# Patient Record
Sex: Male | Born: 1975 | Race: White | Hispanic: No | Marital: Single | Smoking: Never smoker
Health system: Southern US, Community
[De-identification: ages and names within clinical notes are randomized; demographics above are authoritative.]

## PROBLEM LIST (undated history)

## (undated) DIAGNOSIS — F4310 Post-traumatic stress disorder, unspecified: Secondary | ICD-10-CM

## (undated) HISTORY — PX: EYE SURGERY: SHX253

## (undated) HISTORY — PX: KNEE SURGERY: SHX244

## (undated) HISTORY — PX: CHOLECYSTECTOMY: SHX55

## (undated) HISTORY — PX: NASAL SINUS SURGERY: SHX719

---

## 2005-05-07 ENCOUNTER — Emergency Department (HOSPITAL_COMMUNITY): Admission: EM | Admit: 2005-05-07 | Discharge: 2005-05-07 | Payer: Self-pay | Admitting: Emergency Medicine

## 2007-06-21 IMAGING — CT CT ABDOMEN W/ CM
1 of 5 series · 14 of 36 positions shown, 19 images · IV contrast (100 ML OMNI 300)
Comparison: none

CLINICAL DATA: Diffuse abdominal pain status post fall.
 ABDOMEN CT WITH CONTRAST:
TECHNIQUE: Multidetector CT imaging of the abdomen was performed following the standard protocol during bolus administration of intravenous contrast. 
 Contrast:  100 cc Omnipaque 300 IV.  No oral contrast was given.
TECHNIQUE: Multidetector CT imaging of the pelvis was performed following the standard protocol during bolus administration of intravenous contrast.

[Series 2: routine abdomen · axial · 0.81mm/px · z∈[-505,-60]mm · 14 of 98 slices shown, 19 images]
[im 5/98  soft-tissue]
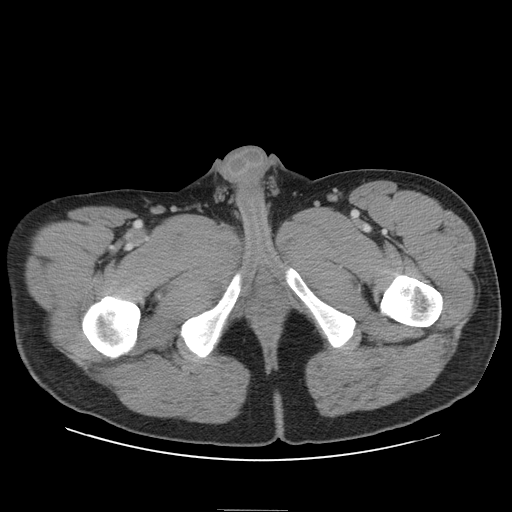
[im 5/98  bone]
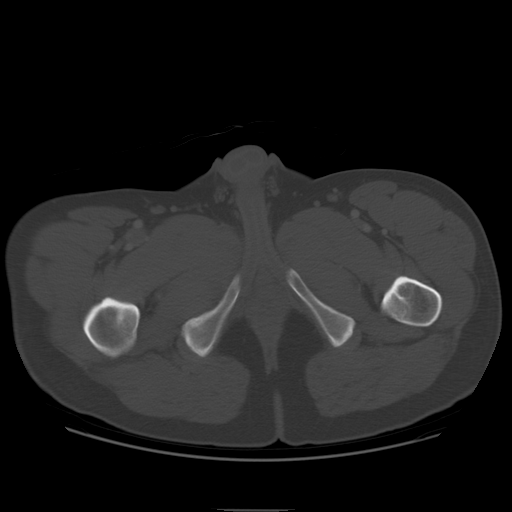
[im 14/98  soft-tissue]
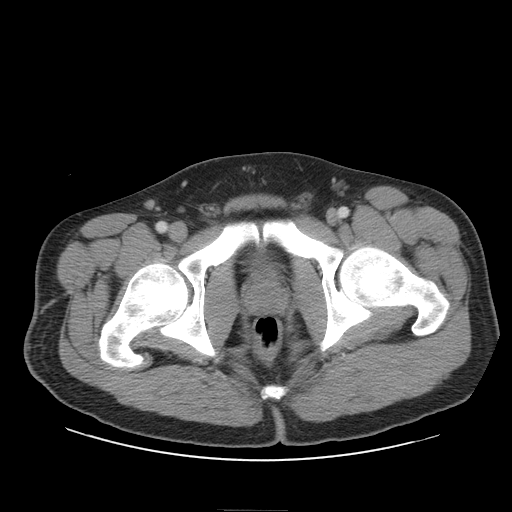
[im 19/98  soft-tissue]
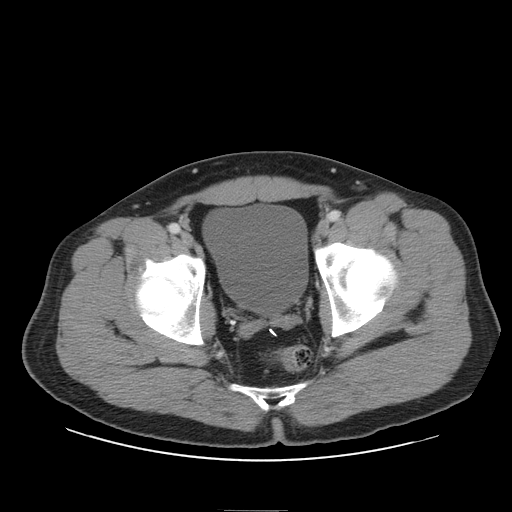
[im 28/98  soft-tissue]
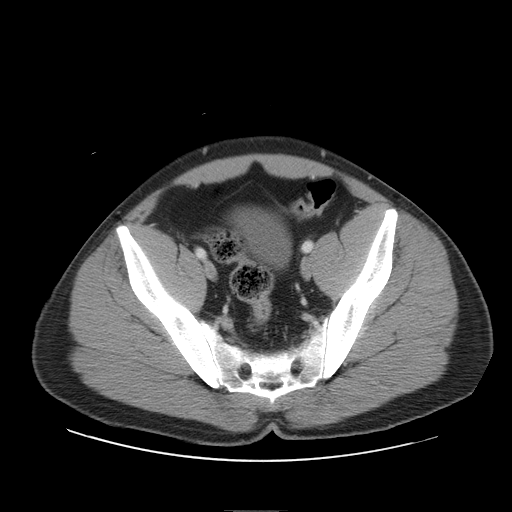
[im 33/98  soft-tissue]
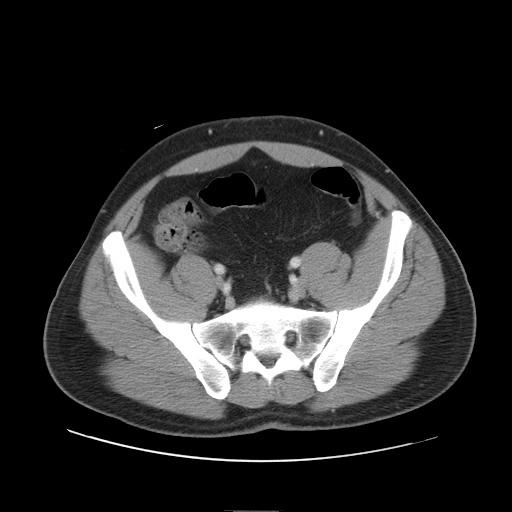
[im 42/98  soft-tissue]
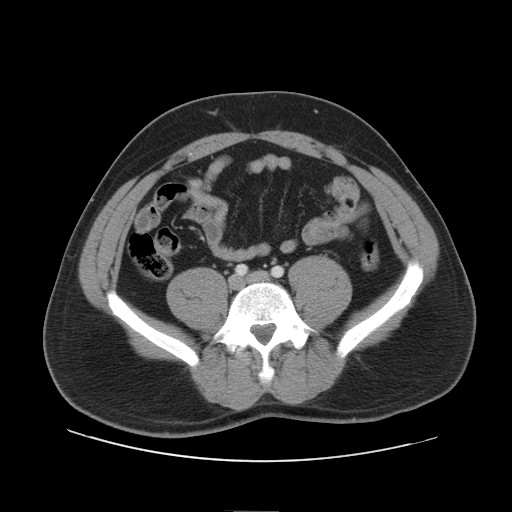
[im 51/98  soft-tissue]
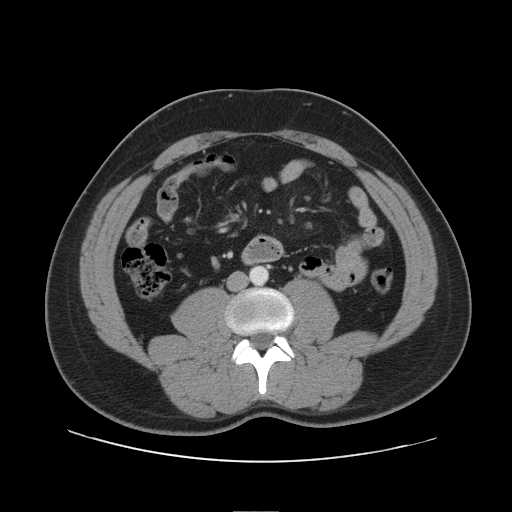
[im 56/98  soft-tissue]
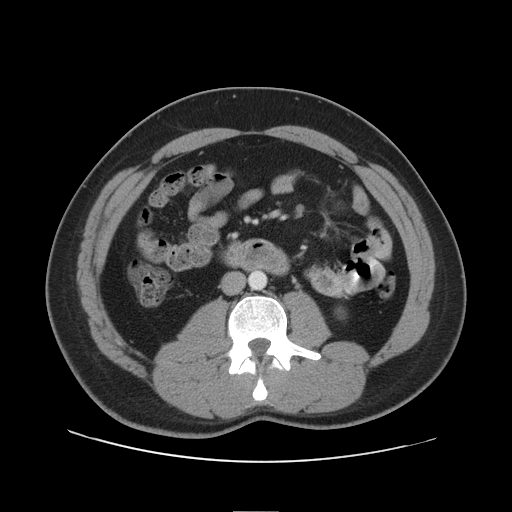
[im 65/98  soft-tissue]
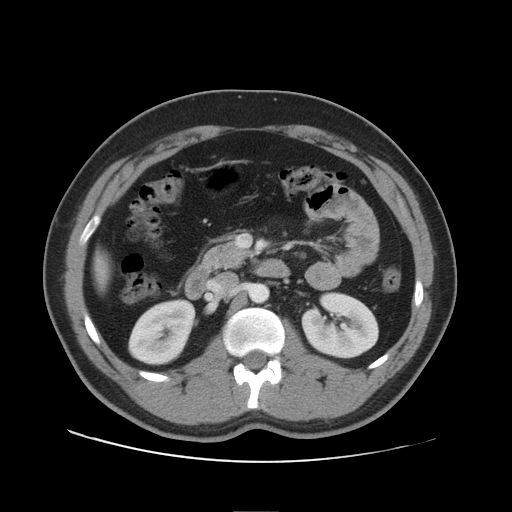
[im 65/98  bone]
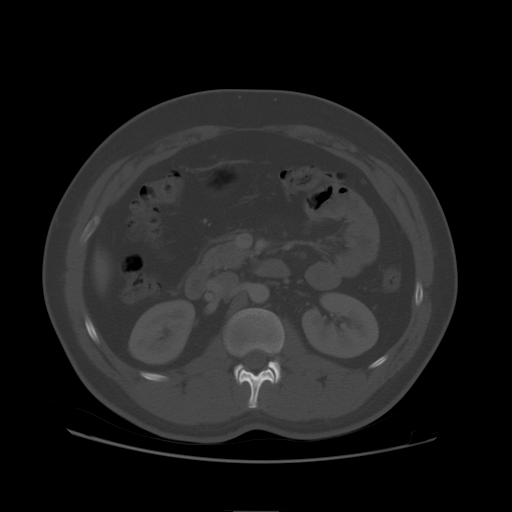
[im 70/98  soft-tissue]
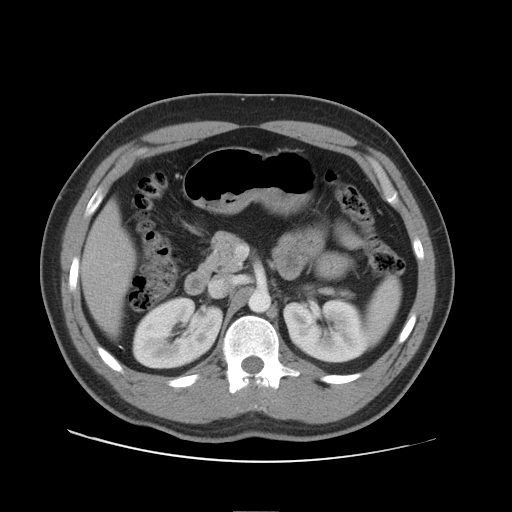
[im 79/98  soft-tissue]
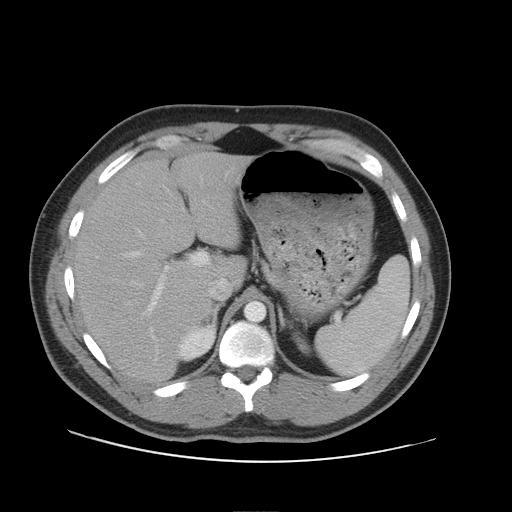
[im 79/98  lung]
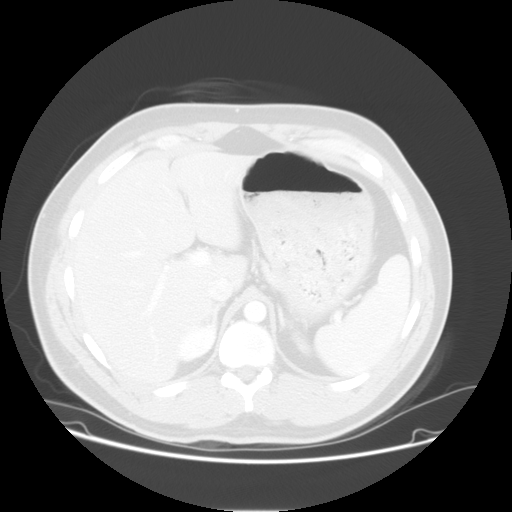
[im 84/98  soft-tissue]
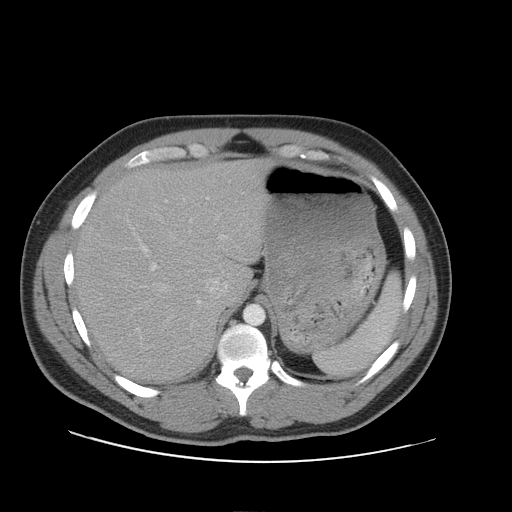
[im 84/98  lung]
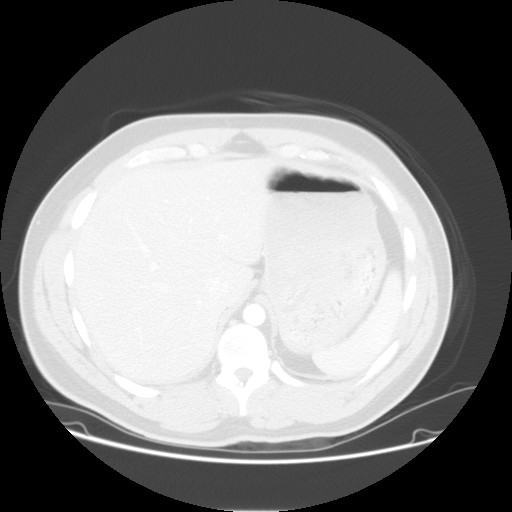
[im 88/98  lung]
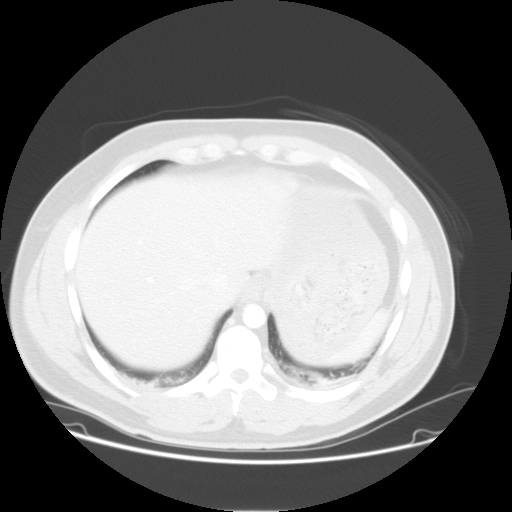
[im 93/98  soft-tissue]
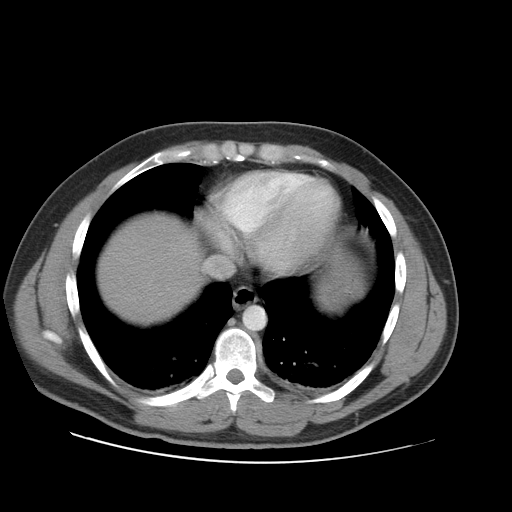
[im 93/98  lung]
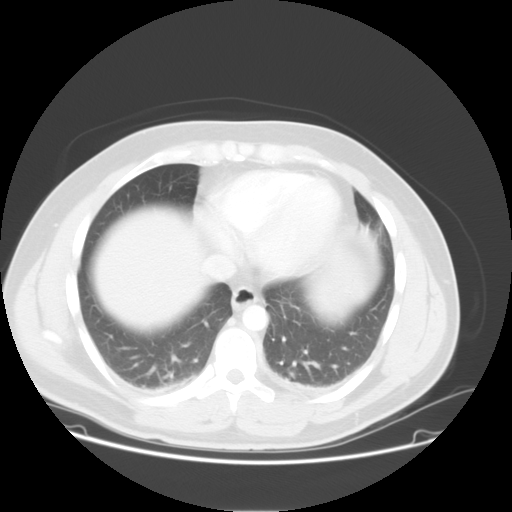

[14 of 36 positions shown; findings below may reference images not displayed]

FINDINGS: There is mild dependent atelectasis in both lung bases.  No pleural effusion or basilar pneumothorax is demonstrated.  No fractures are seen.  The patient is status post cholecystectomy.  The liver, spleen, pancreas, adrenal glands, and kidneys appear normal.  There are a few scattered mesenteric lymph nodes which are within normal limits for size.  No hemoperitoneum or retroperitoneal hemorrhage is seen.
IMPRESSION: 1.  No acute abdominal findings demonstrated.
 2.  Mild bibasilar atelectasis.  Postoperative cholecystectomy.
 PELVIS CT WITH CONTRAST:
FINDINGS: There is a surgical clip posterior to the seminal vesicles.  No pelvic fracture, hemoperitoneum or hemorrhage is demonstrated.  There is no evidence of a mass or an inflammatory process.
IMPRESSION: No acute pelvic findings or evidence of acute pelvic injury.

## 2007-06-21 IMAGING — CR DG CHEST 2V
2 series · 2 of 2 positions shown · non-contrast
Comparison: none

CLINICAL DATA: Left chest pain status post injury.
 CHEST - 2 VIEW: 
 PA and lateral chest.  Comparison:   Abdominal CT done earlier today.

[view not recorded (1 of 2)]
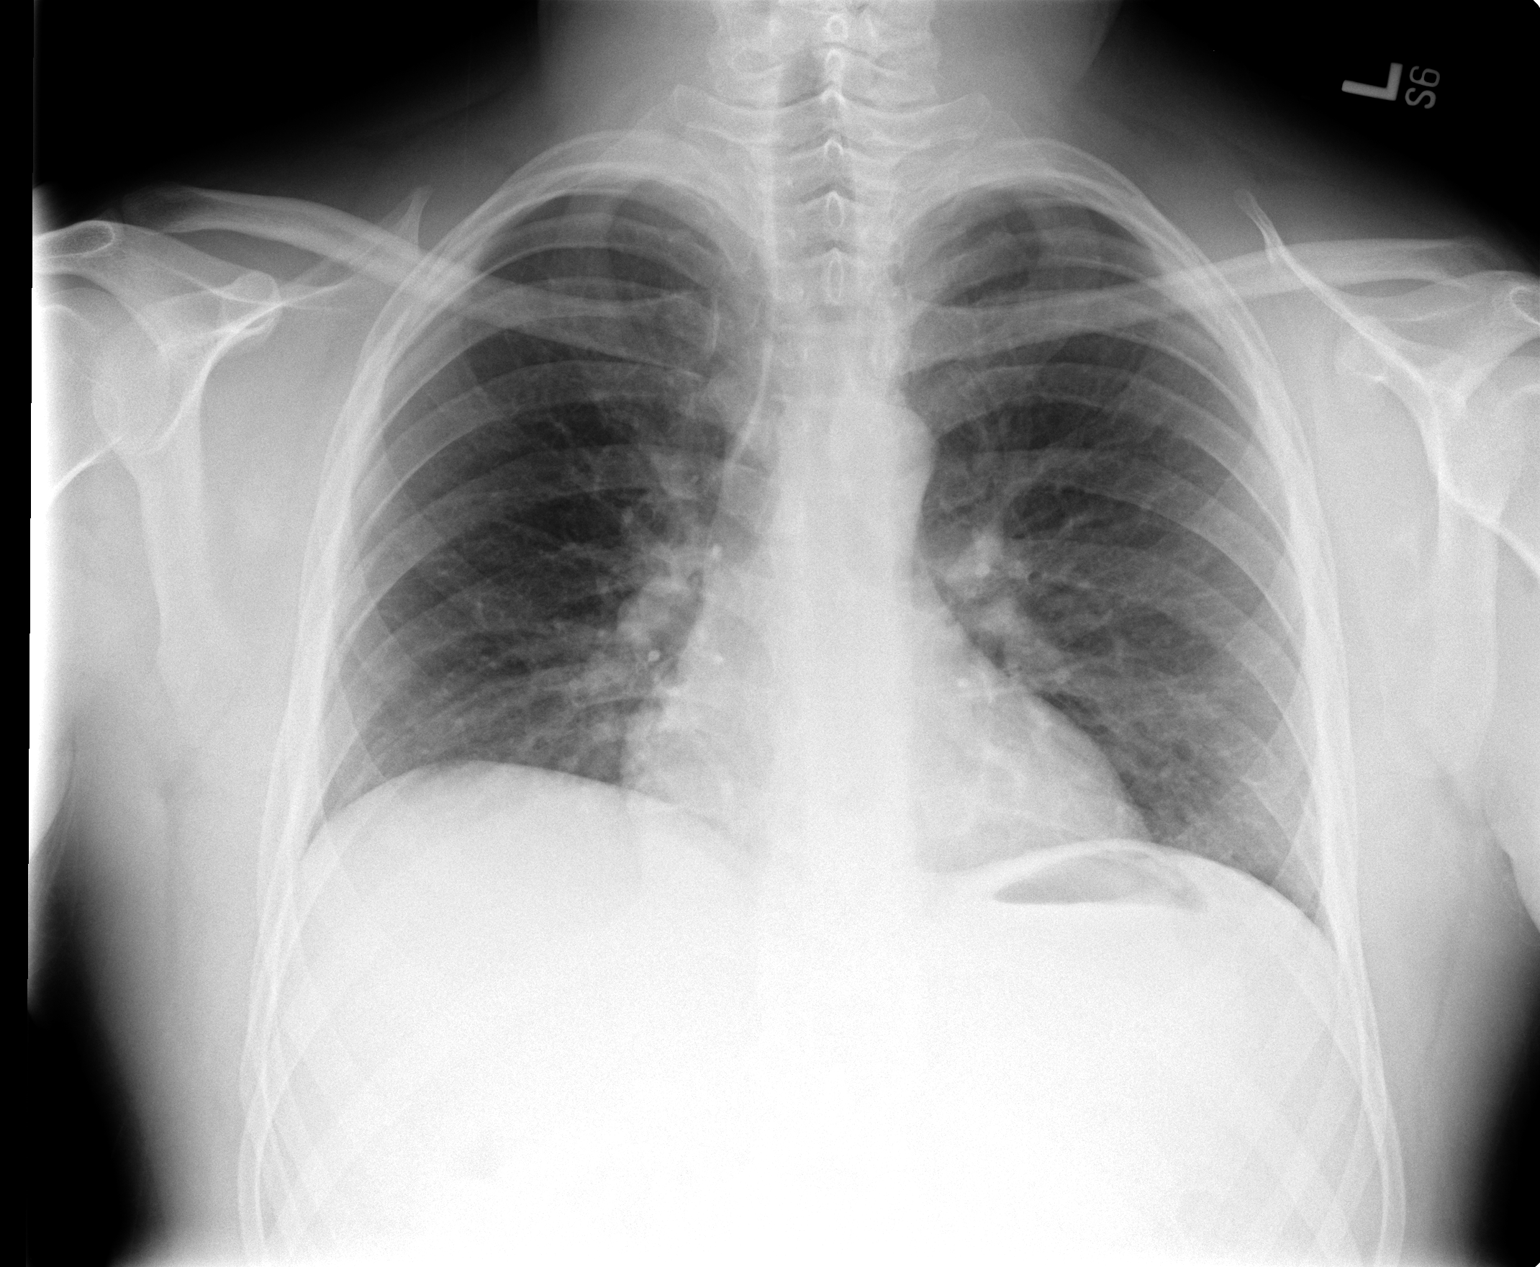

[view not recorded (2 of 2)]
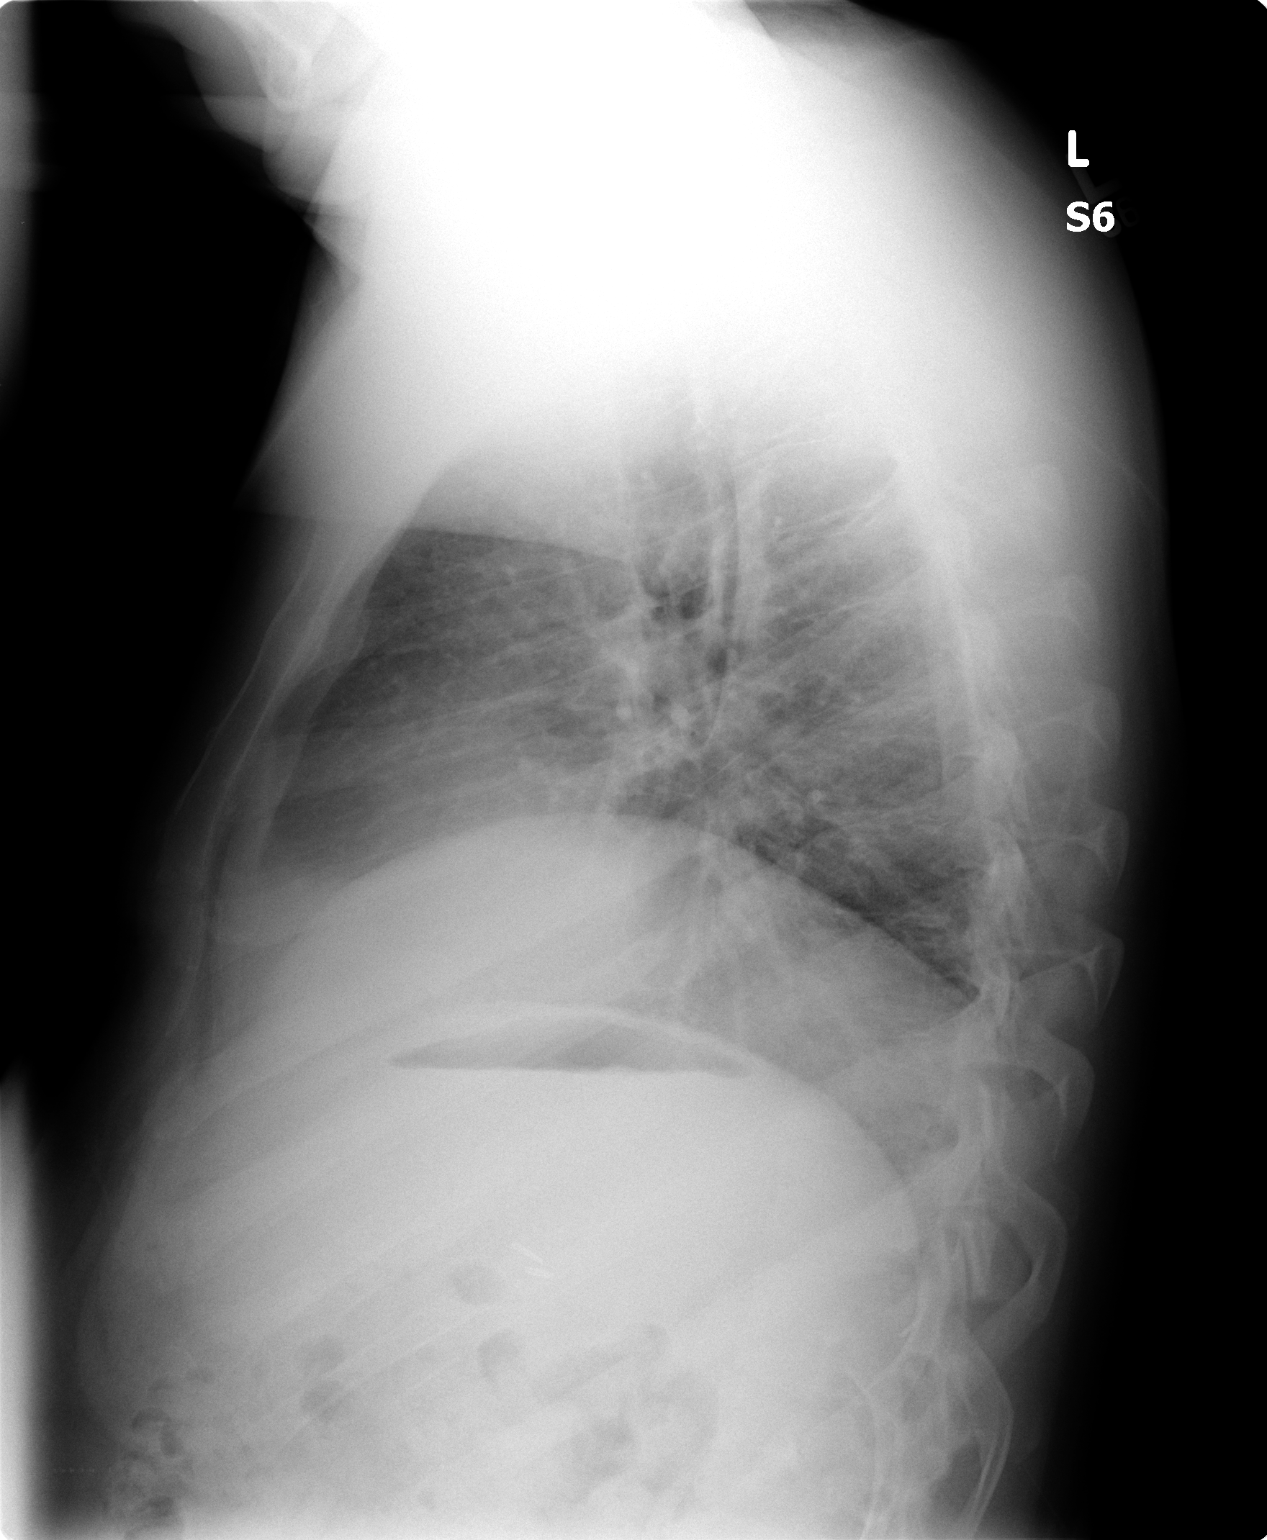

[2 of 2 positions shown; findings below may reference images not displayed]

FINDINGS: There are low lung volumes with bibasilar atelectasis.  The cardiomediastinal contours are normal without evidence of mediastinal hematoma.  Central airway thickening is present.  There is no pleural effusion or pneumothorax.  No fractures are seen.
IMPRESSION: Bibasilar atelectasis with central airway thickening.  No evidence of pleural effusion or pneumothorax.

## 2007-06-29 ENCOUNTER — Emergency Department (HOSPITAL_COMMUNITY): Admission: EM | Admit: 2007-06-29 | Discharge: 2007-06-30 | Payer: Self-pay | Admitting: Emergency Medicine

## 2007-10-27 ENCOUNTER — Emergency Department (HOSPITAL_COMMUNITY): Admission: EM | Admit: 2007-10-27 | Discharge: 2007-10-27 | Payer: Self-pay | Admitting: Emergency Medicine

## 2008-09-15 ENCOUNTER — Emergency Department (HOSPITAL_COMMUNITY): Admission: EM | Admit: 2008-09-15 | Discharge: 2008-09-15 | Payer: Self-pay | Admitting: Emergency Medicine

## 2009-08-13 IMAGING — CT CT ABDOMEN W/ CM
3 of 5 series · 13 of 36 positions shown, 19 images · IV contrast (OMNI 300/WATER & 100 ML OMNI 300)
Comparison: 05/07/05.

CLINICAL DATA: Abdominal and pelvic pain with nausea.  
ABDOMEN CT WITH CONTRAST:
TECHNIQUE: Multidetector CT imaging of the abdomen was performed following the standard protocol during bolus administration of intravenous contrast.
Contrast:  100 cc Omnipaque 300 and oral contrast.
TECHNIQUE: Multidetector CT imaging of the pelvis was performed following the standard protocol during bolus administration of intravenous contrast.

[Series 2: routine abdomen · axial · 0.76mm/px · z∈[-496,-110]mm · 8 of 99 slices shown, 13 images]
[im 11/99  soft-tissue]
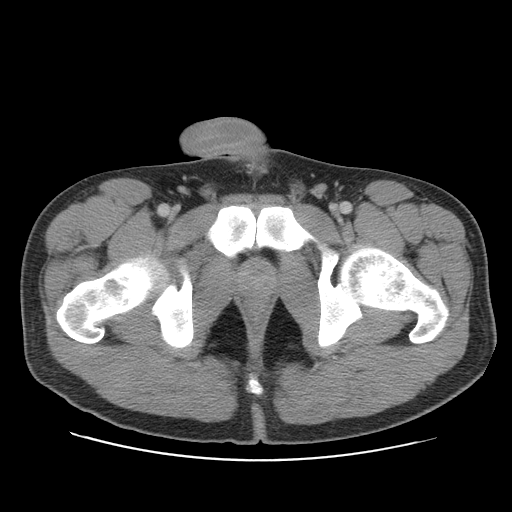
[im 11/99  bone]
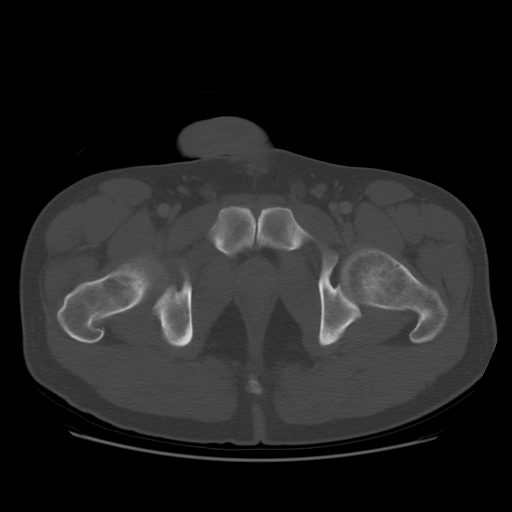
[im 22/99  soft-tissue]
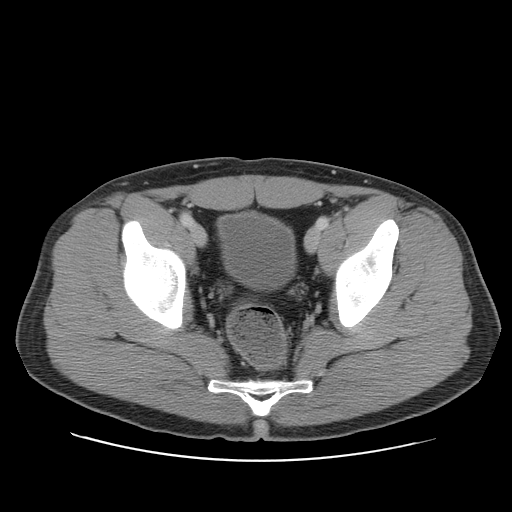
[im 33/99  soft-tissue]
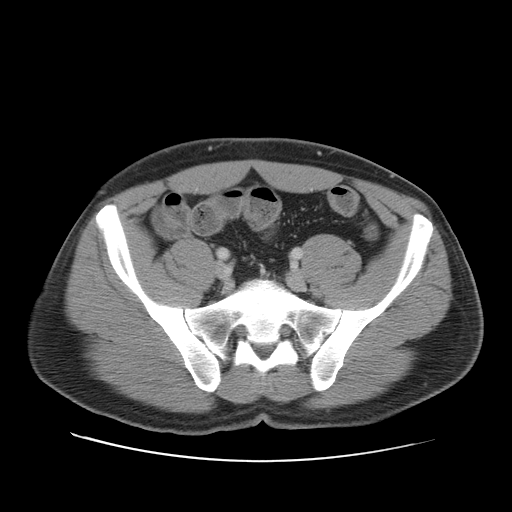
[im 44/99  soft-tissue]
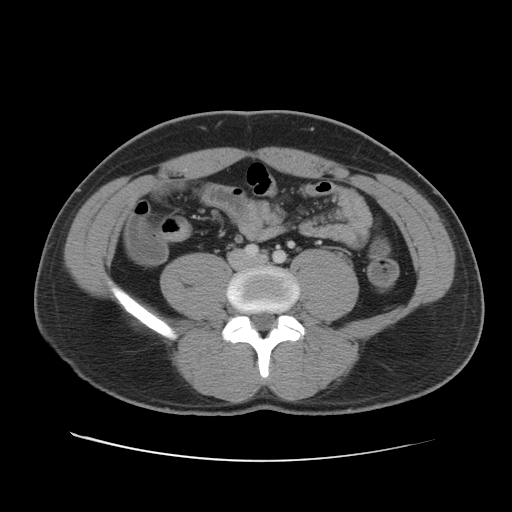
[im 55/99  soft-tissue]
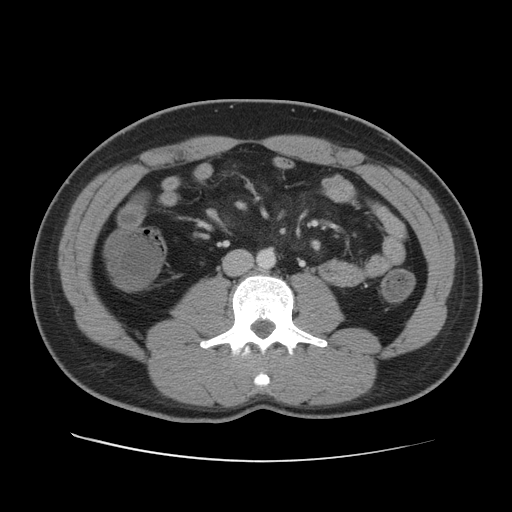
[im 55/99  lung]
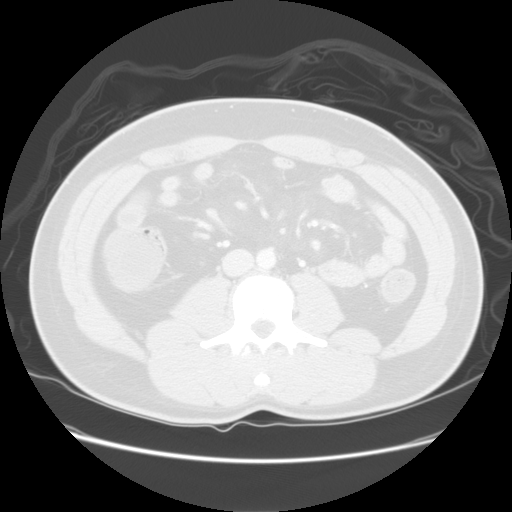
[im 66/99  soft-tissue]
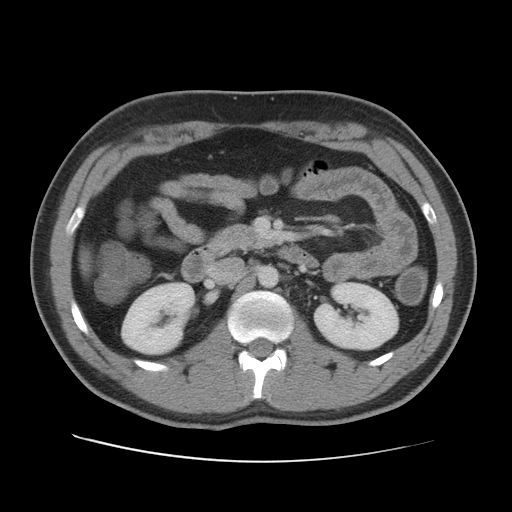
[im 66/99  lung]
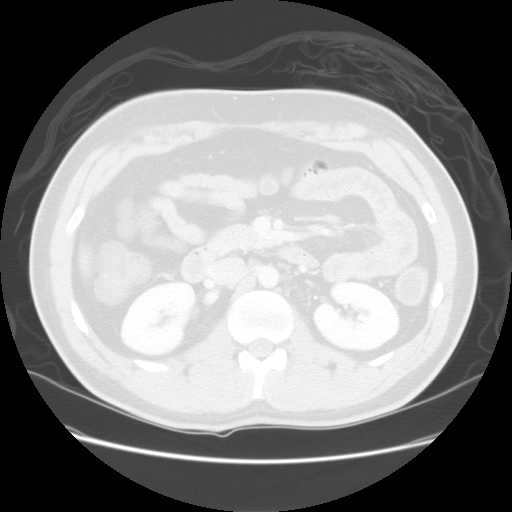
[im 77/99  soft-tissue]
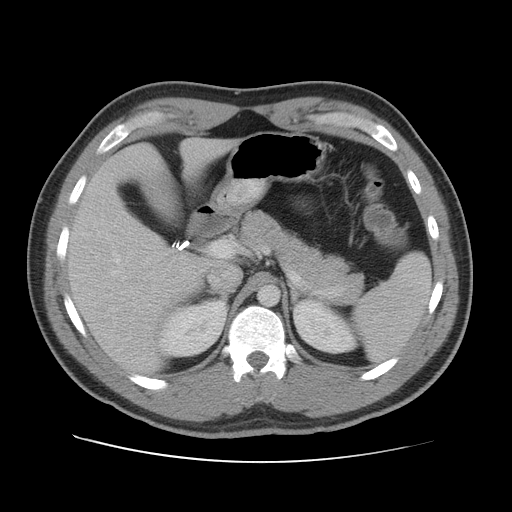
[im 77/99  lung]
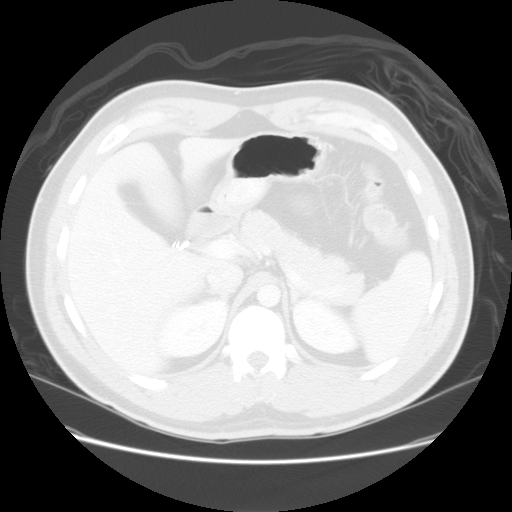
[im 88/99  soft-tissue]
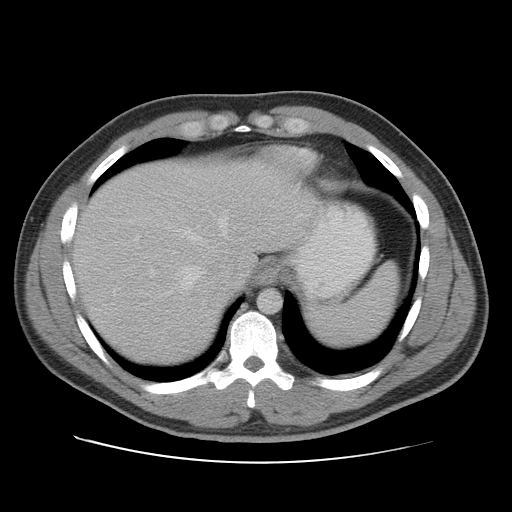
[im 88/99  lung]
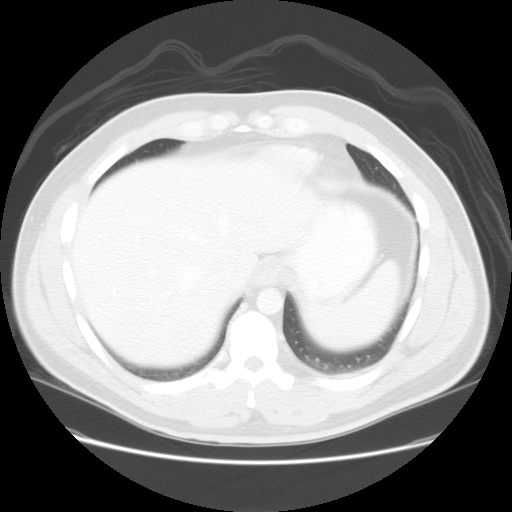

[Series 400: sag · sagittal · 1.01mm/px · 1 of 178 slices shown, 2 images]
[im 60/178  soft-tissue]
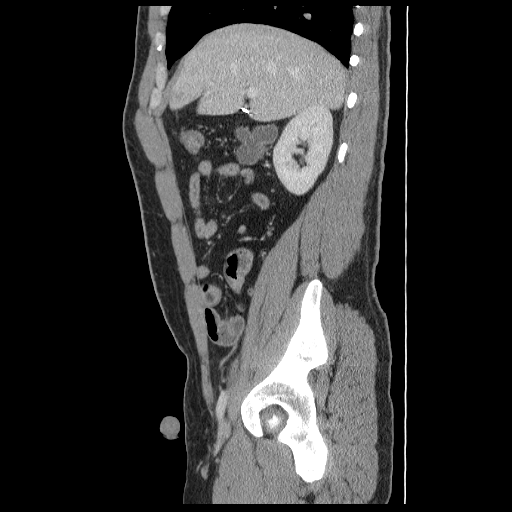
[im 60/178  bone]
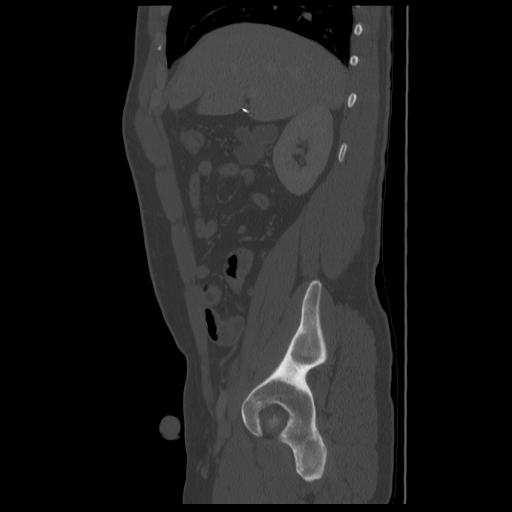

[Series 401: cor · coronal · 1.01mm/px · 4 of 136 slices shown]
[im 10/136  soft-tissue]
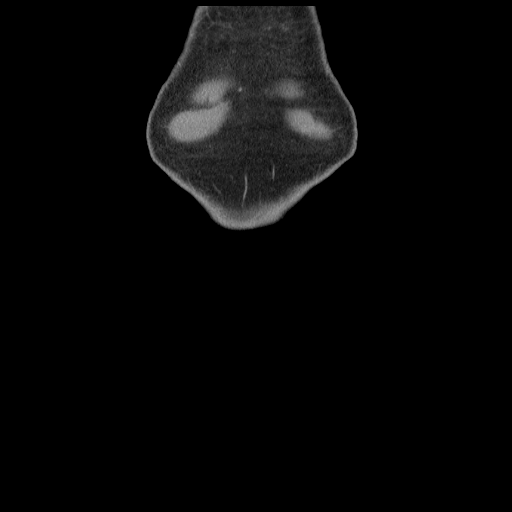
[im 29/136  soft-tissue]
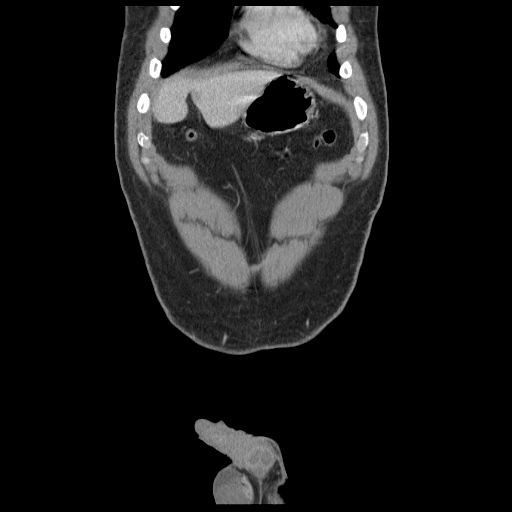
[im 49/136  soft-tissue]
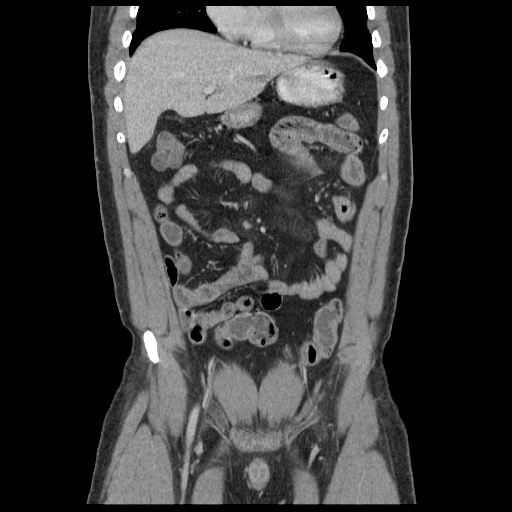
[im 58/136  soft-tissue]
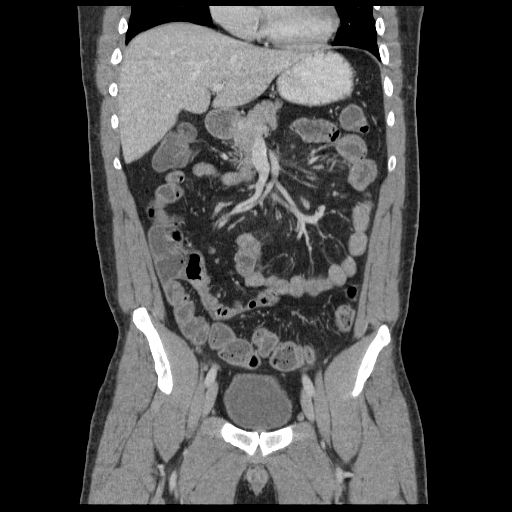

[13 of 36 positions shown; findings below may reference images not displayed]

FINDINGS: The liver, spleen, adrenal glands, and pancreas are unremarkable.  Patient is status post cholecystectomy.  There is prominent wall enhancement of multiple small bowel loops and of the colon.  There are mildly enlarged multiple mesenteric lymph nodes with mild mesenteric inflammation.  No evidence of free fluid, abdominal aortic aneurysm, or biliary dilatation identified.  There is no evidence of pneumoperitoneum or bowel obstruction.
IMPRESSION: Prominent bowel wall enhancement of the small bowel and colon and mildly enlarged mesenteric lymph nodes likely representing enteritis/colitis/mesenteric adenitis.  
PELVIS CT WITH CONTRAST:
FINDINGS: Mildly prominent wall enhancement of colon and small bowel loops are noted.  No free fluid or bowel obstruction noted.  The bladder is within normal limits.
IMPRESSION: Mildly prominent wall enhancement of the colon and small bowel loops likely representing enteritis/colitis/mesenteric adenitis.

## 2010-09-10 ENCOUNTER — Emergency Department (HOSPITAL_COMMUNITY)
Admission: EM | Admit: 2010-09-10 | Discharge: 2010-09-11 | Disposition: A | Discharge status: Home / Self Care | Payer: 59 | Attending: Emergency Medicine | Admitting: Emergency Medicine

## 2010-09-10 ENCOUNTER — Emergency Department (HOSPITAL_COMMUNITY): Payer: 59

## 2010-09-10 DIAGNOSIS — R110 Nausea: Secondary | ICD-10-CM | POA: Insufficient documentation

## 2010-09-10 DIAGNOSIS — R079 Chest pain, unspecified: Secondary | ICD-10-CM | POA: Insufficient documentation

## 2010-09-10 LAB — DIFFERENTIAL
Basophils Relative: 0 % (ref 0–1)
Eosinophils Absolute: 0.1 10*3/uL (ref 0.0–0.7)
Eosinophils Relative: 1 % (ref 0–5)
Lymphs Abs: 3.4 10*3/uL (ref 0.7–4.0)
Monocytes Absolute: 1.2 10*3/uL — ABNORMAL HIGH (ref 0.1–1.0)
Monocytes Relative: 8 % (ref 3–12)
Neutro Abs: 10 10*3/uL — ABNORMAL HIGH (ref 1.7–7.7)
Neutrophils Relative %: 68 % (ref 43–77)

## 2010-09-10 LAB — BASIC METABOLIC PANEL
CO2: 25 mEq/L (ref 19–32)
Calcium: 9.5 mg/dL (ref 8.4–10.5)
Chloride: 102 mEq/L (ref 96–112)
Creatinine, Ser: 0.95 mg/dL (ref 0.4–1.5)
GFR calc Af Amer: >60 mL/min (ref >60)
Glucose, Bld: 122 mg/dL — ABNORMAL HIGH (ref 70–99)
Potassium: 3.5 mEq/L (ref 3.5–5.1)
Sodium: 135 mEq/L (ref 135–145)

## 2010-09-10 LAB — POCT CARDIAC MARKERS
Myoglobin, poc: 53.5 ng/mL (ref 12–200)
Troponin i, poc: <0.05 ng/mL (ref 0.00–0.09)

## 2010-09-10 LAB — CBC
Hemoglobin: 14.4 g/dL (ref 13.0–17.0)
MCH: 31.3 pg (ref 26.0–34.0)
MCV: 89.1 fL (ref 78.0–100.0)
Platelets: 247 10*3/uL (ref 150–400)
RBC: 4.6 MIL/uL (ref 4.22–5.81)
WBC: 14.7 10*3/uL — ABNORMAL HIGH (ref 4.0–10.5)

## 2010-10-30 IMAGING — CR DG ANKLE COMPLETE 3+V*R*
3 series · 3 of 3 positions shown · non-contrast
Comparison: None

CLINICAL DATA: Ankle injury

RIGHT ANKLE - COMPLETE 3+ VIEW

[view not recorded (1 of 3)]
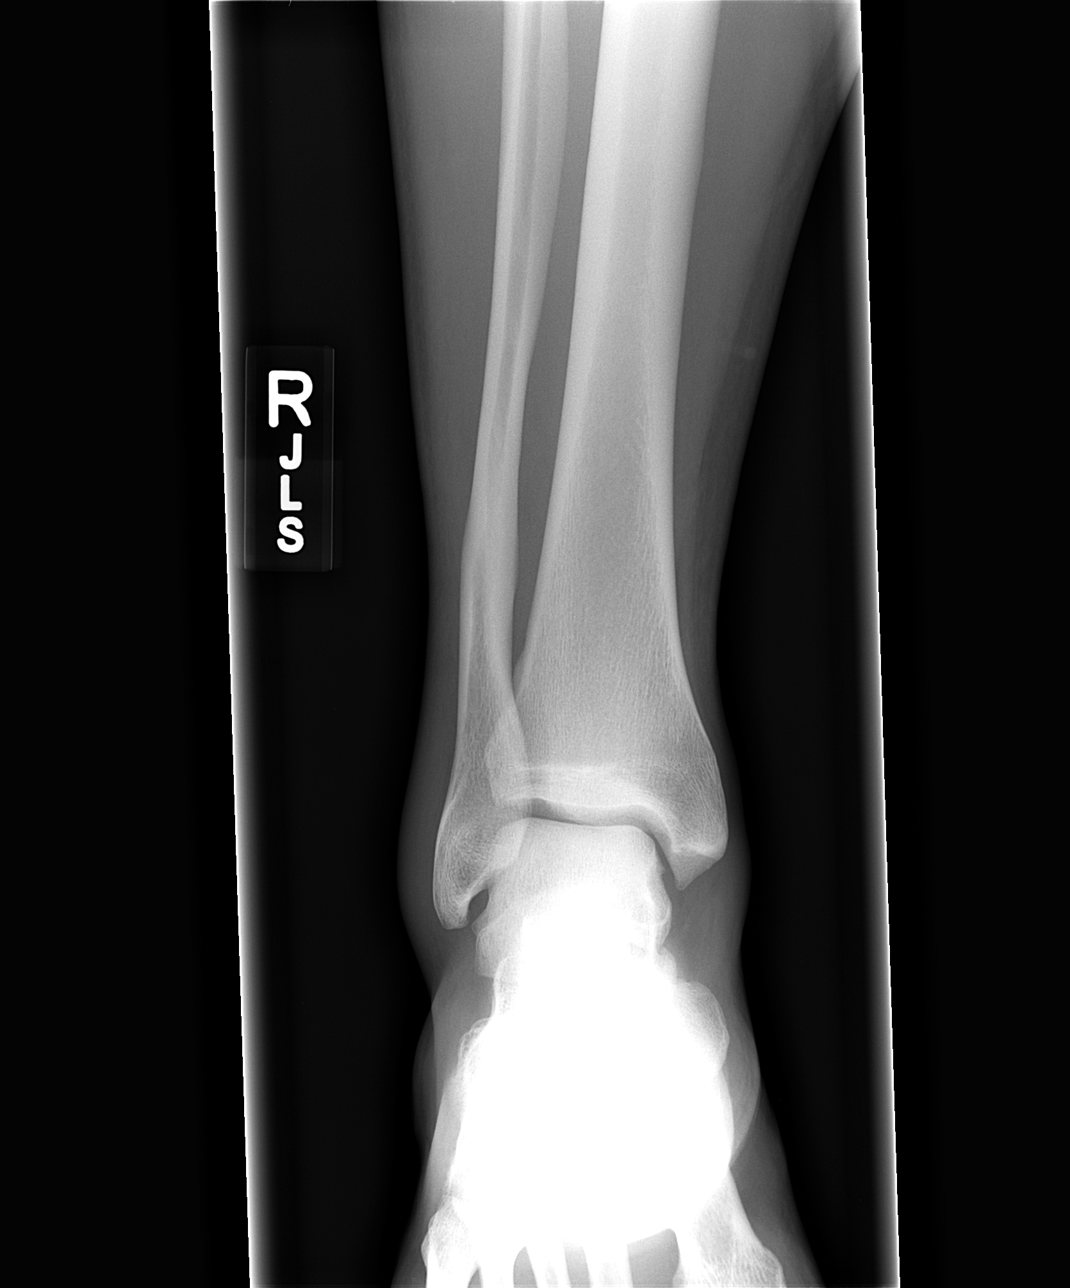

[view not recorded (2 of 3)]
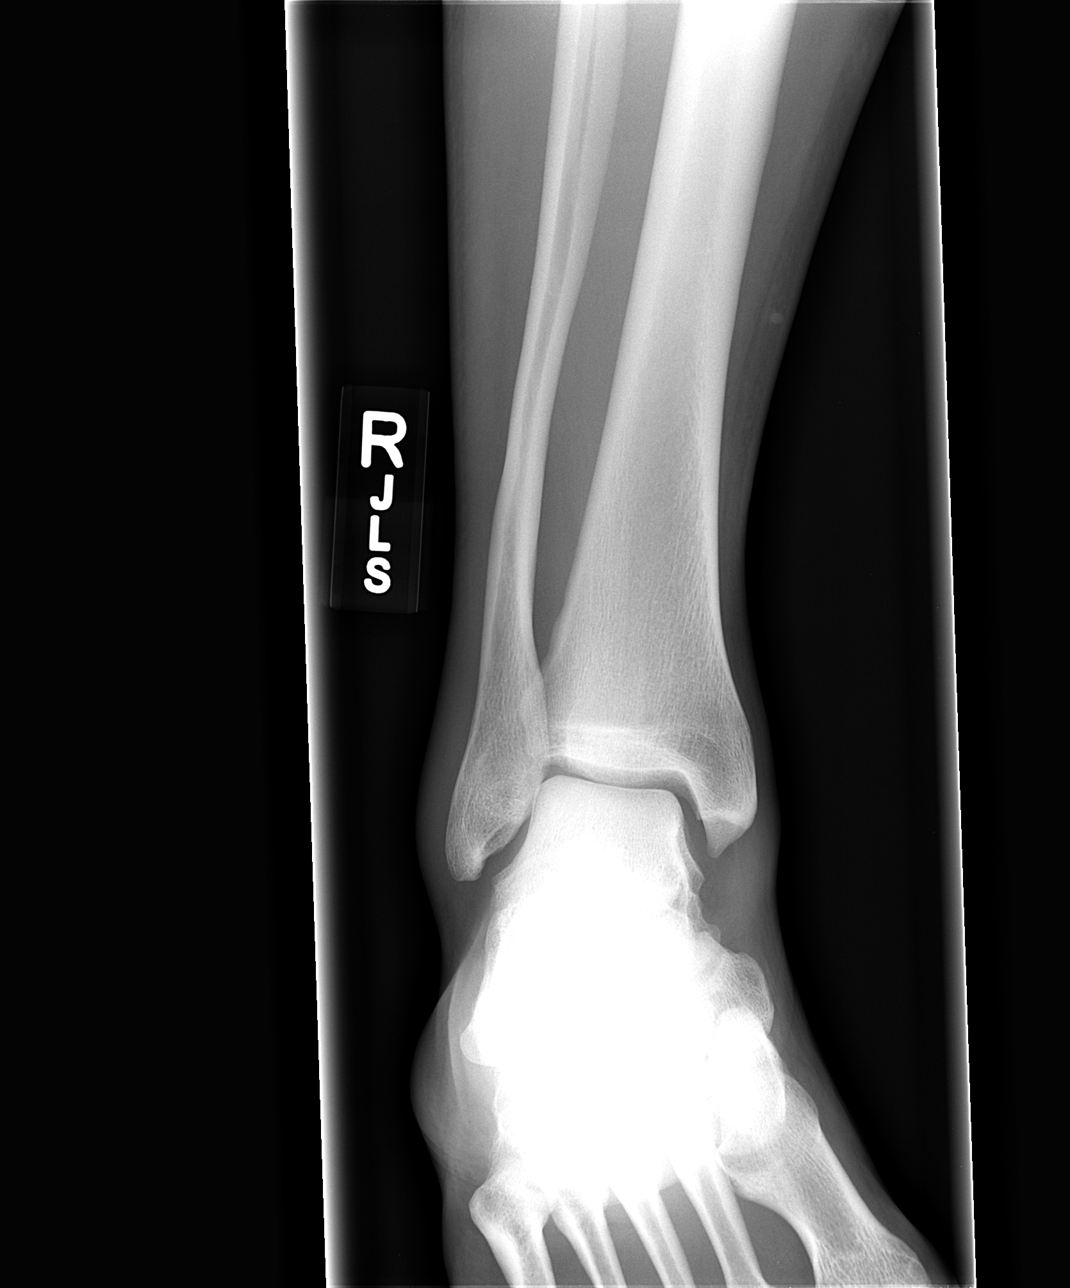

[view not recorded (3 of 3)]
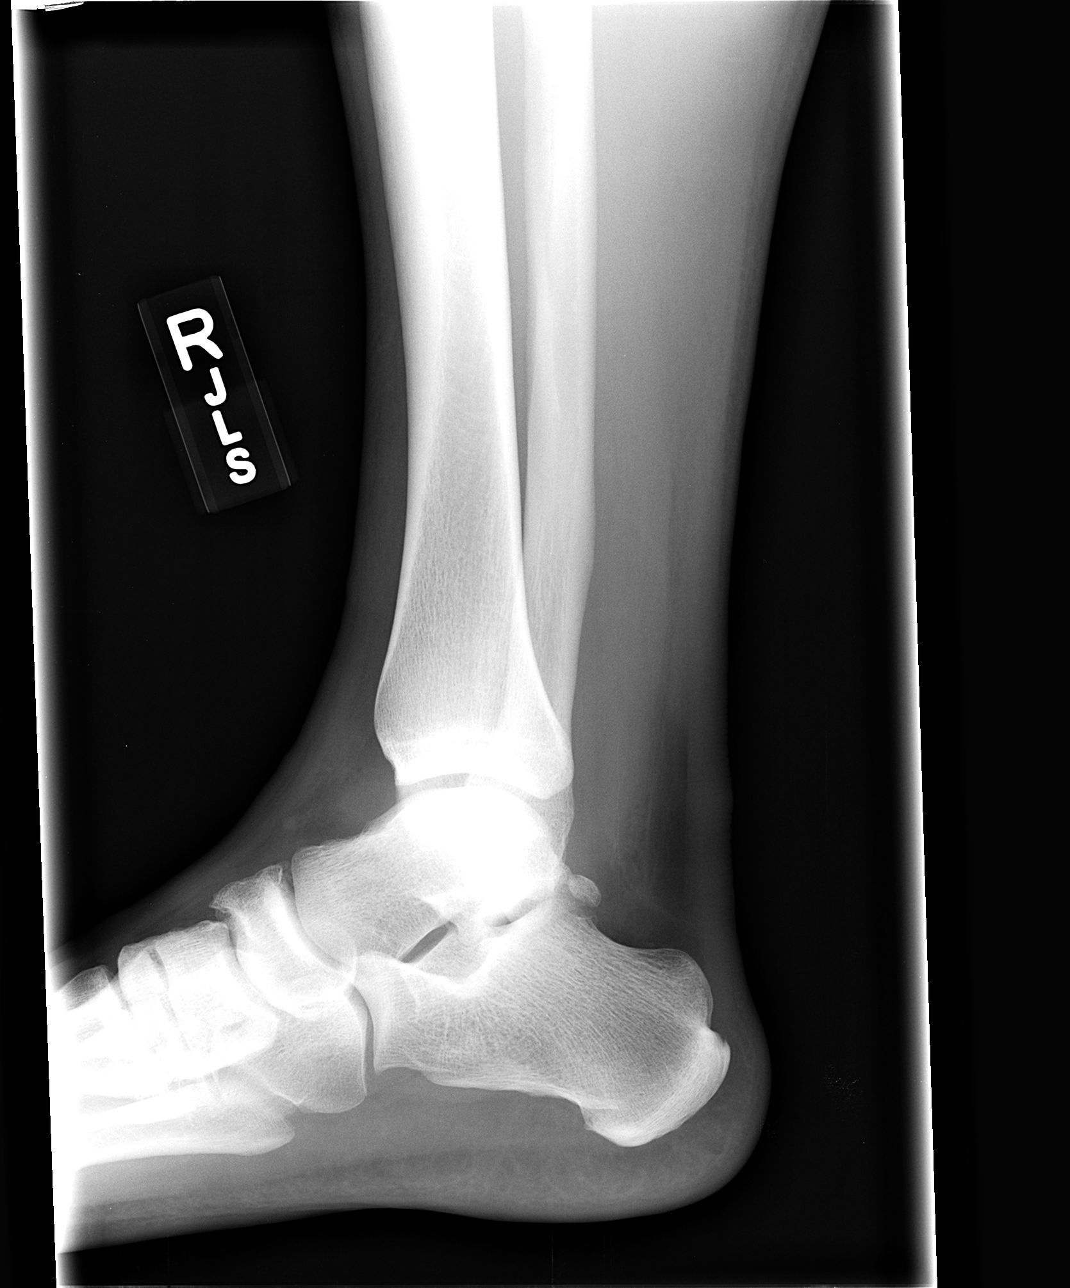

[3 of 3 positions shown; findings below may reference images not displayed]

FINDINGS: Ankle is located.  There is suggestion of an ankle joint
effusion.  There is mild focal mild soft tissue swelling about the
ankle, slightly more prominent laterally.  Joint spaces maintained.
No acute or healing fracture is identified.  Base of fifth
metatarsal appears intact.  Slight irregularity of the dorsal
aspect of the navicular bone appears chronic.
IMPRESSION: 1.  Mild diffuse soft tissue swelling and suggestion of an ankle
joint effusion.
2.  No acute bony abnormality identified.

## 2011-03-15 ENCOUNTER — Other Ambulatory Visit: Payer: Self-pay | Admitting: Occupational Medicine

## 2011-03-15 ENCOUNTER — Ambulatory Visit: Payer: Self-pay

## 2011-03-15 DIAGNOSIS — R52 Pain, unspecified: Secondary | ICD-10-CM

## 2011-04-26 LAB — CBC
HCT: 43.3
Hemoglobin: 14.7
MCHC: 33.9
MCV: 90.7
Platelets: 219
RBC: 4.78
RDW: 12.6
WBC: 15.1 — ABNORMAL HIGH

## 2011-04-26 LAB — DIFFERENTIAL
Basophils Absolute: 0
Basophils Relative: 0
Eosinophils Absolute: 0 — ABNORMAL LOW
Eosinophils Relative: 0
Lymphocytes Relative: 5 — ABNORMAL LOW
Lymphs Abs: 0.7
Monocytes Absolute: 0.3
Monocytes Relative: 2 — ABNORMAL LOW
Neutro Abs: 14 — ABNORMAL HIGH
Neutrophils Relative %: 92 — ABNORMAL HIGH

## 2011-04-26 LAB — HEPATIC FUNCTION PANEL
ALT: 26
AST: 35
Albumin: 3.9
Alkaline Phosphatase: 40
Bilirubin, Direct: 0.3
Indirect Bilirubin: 0.7
Total Bilirubin: 1
Total Protein: 6.5

## 2011-04-26 LAB — URINALYSIS, ROUTINE W REFLEX MICROSCOPIC
Bilirubin Urine: NEGATIVE
Glucose, UA: NEGATIVE
Hgb urine dipstick: NEGATIVE
Ketones, ur: 15 — AB
Nitrite: NEGATIVE
Protein, ur: NEGATIVE
Specific Gravity, Urine: 1.023
Urobilinogen, UA: 0.2
pH: 6

## 2011-04-26 LAB — I-STAT 8, (EC8 V) (CONVERTED LAB)
Acid-base deficit: 1
BUN: 28 — ABNORMAL HIGH
Bicarbonate: 24.3 — ABNORMAL HIGH
Chloride: 106
Glucose, Bld: 122 — ABNORMAL HIGH
HCT: 45
Hemoglobin: 15.3
Operator id: 294341
Potassium: 4
Sodium: 139
TCO2: 26
pCO2, Ven: 40.1 — ABNORMAL LOW
pH, Ven: 7.391 — ABNORMAL HIGH

## 2011-04-26 LAB — LIPASE, BLOOD: Lipase: 24

## 2011-04-26 LAB — POCT I-STAT CREATININE
Creatinine, Ser: 1.1
Operator id: 294341

## 2011-08-16 ENCOUNTER — Other Ambulatory Visit: Payer: Self-pay | Admitting: Orthopedic Surgery

## 2011-08-16 DIAGNOSIS — M7120 Synovial cyst of popliteal space [Baker], unspecified knee: Secondary | ICD-10-CM

## 2011-08-17 ENCOUNTER — Other Ambulatory Visit: Payer: Self-pay | Admitting: Orthopedic Surgery

## 2011-08-17 ENCOUNTER — Ambulatory Visit
Admission: RE | Admit: 2011-08-17 | Discharge: 2011-08-17 | Disposition: A | Discharge status: Home / Self Care | Payer: 59 | Source: Ambulatory Visit | Attending: Orthopedic Surgery | Admitting: Orthopedic Surgery

## 2011-08-17 DIAGNOSIS — M7120 Synovial cyst of popliteal space [Baker], unspecified knee: Secondary | ICD-10-CM

## 2012-10-20 ENCOUNTER — Emergency Department (HOSPITAL_COMMUNITY)
Admission: EM | Admit: 2012-10-20 | Discharge: 2012-10-20 | Disposition: A | Discharge status: Home / Self Care | Payer: Worker's Compensation | Attending: Emergency Medicine | Admitting: Emergency Medicine

## 2012-10-20 ENCOUNTER — Encounter (HOSPITAL_COMMUNITY): Payer: Self-pay

## 2012-10-20 ENCOUNTER — Emergency Department (HOSPITAL_COMMUNITY): Payer: Worker's Compensation

## 2012-10-20 DIAGNOSIS — R296 Repeated falls: Secondary | ICD-10-CM | POA: Insufficient documentation

## 2012-10-20 DIAGNOSIS — M25561 Pain in right knee: Secondary | ICD-10-CM

## 2012-10-20 DIAGNOSIS — Y9269 Other specified industrial and construction area as the place of occurrence of the external cause: Secondary | ICD-10-CM | POA: Insufficient documentation

## 2012-10-20 DIAGNOSIS — Y9301 Activity, walking, marching and hiking: Secondary | ICD-10-CM | POA: Insufficient documentation

## 2012-10-20 DIAGNOSIS — Y990 Civilian activity done for income or pay: Secondary | ICD-10-CM | POA: Insufficient documentation

## 2012-10-20 DIAGNOSIS — Z87828 Personal history of other (healed) physical injury and trauma: Secondary | ICD-10-CM | POA: Insufficient documentation

## 2012-10-20 DIAGNOSIS — S8990XA Unspecified injury of unspecified lower leg, initial encounter: Secondary | ICD-10-CM | POA: Insufficient documentation

## 2012-10-20 NOTE — ED Provider Notes (Signed)
History    This chart was scribed for non-physician practitioner working with Gerhard Munch, MD by Frederik Pear, ED Scribe. This patient was seen in room TR11C/TR11C and the patient's care was started at 1939.   CSN: [id number]  Arrival date & time 10/20/12  1823   First MD Initiated Contact with Patient 10/20/12 1939      Chief Complaint  Patient presents with  . Knee Injury    (Consider location/radiation/quality/duration/timing/severity/associated sxs/prior treatment) Patient is a 37 y.o. male presenting with knee pain. The history is provided by the patient and medical records. No language interpreter was used.  Knee Pain Location:  Knee Injury: yes   Mechanism of injury: fall   Fall:    Fall occurred:  Walking   Impact surface:  Primary school teacher of impact:  Knees   Entrapped after fall: no   Knee location:  R knee Pain details:    Radiates to:  Does not radiate   Severity:  Mild   Onset quality:  Sudden   Timing:  Constant   Progression:  Unchanged   Jeremy Elliott is a 37 y.o. male with a h/o of an ACL tear in Oct 2012, which was repaired by Dr. 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 He states that he is unable to ambulate after the injury. He denies having any difficulties with the knee since his surgery was performed.   History reviewed. No pertinent past medical history.  Past Surgical History  Procedure Laterality Date  . Knee surgery    . Cholecystectomy    . Nasal sinus surgery    . Eye surgery      History reviewed. No pertinent family history.  History  Substance Use Topics  . Smoking status: Never Smoker   . Smokeless tobacco: Not on file  . Alcohol Use: No      Review of Systems  Musculoskeletal: Positive for  arthralgias (knee pain).  All other systems reviewed and are negative.    Allergies  Review of patient's allergies indicates no known allergies.  Home Medications   Current Outpatient Rx  Name  Route  Sig  Dispense  Refill  . clomiPHENE (CLOMID) 50 MG tablet   Oral   Take 25 mg by mouth daily.         Marland Kitchen omeprazole (PRILOSEC) 40 MG capsule   Oral   Take 40 mg by mouth daily.           BP 147/83  Pulse 96  Temp(Src) 97.9 F (36.6 C)  Resp 18  SpO2 96%  Physical Exam  Nursing note and vitals reviewed. Constitutional: He is oriented to person, place, and time. He appears well-developed and well-nourished. No distress.  HENT:  Head: Normocephalic and atraumatic.  Eyes: EOM are normal. Pupils are equal, round, and reactive to light.  Neck: Normal range of motion. Neck supple. No tracheal deviation present.  Cardiovascular: Normal rate.   Pulmonary/Chest: Effort normal. No respiratory distress.  Abdominal: Soft. He exhibits no distension.  Musculoskeletal: Normal range of motion. He exhibits tenderness. He exhibits no edema.  Point tenderness to the lateral side of the right knee. No true instability, but pt states that he is unable to ambulate. No laxity felt upon exam.  Neurological: He is alert and oriented to person, place, and time.  Skin: Skin is warm and dry.  Psychiatric: He has a normal mood and affect. His behavior is normal.    ED Course  Procedures (including critical care time)  DIAGNOSTIC STUDIES: Oxygen Saturation is 96% on room air, adequate by my interpretation.    COORDINATION OF CARE:  19:35- Discussed planned course of treatment with the patient, including a right knee X-ray, a knee immobilizer, crutches, and following up with Dr. Eulah Pont, who is agreeable at this time.   Labs Reviewed - No data to display Dg Knee Complete 4 Views Right  10/20/2012  *RADIOLOGY REPORT*  Clinical Data: Knee injury  RIGHT KNEE - COMPLETE 4+ VIEW  Comparison:  03/15/2011  Findings: Previous ACL repair.  There is no joint effusion.  No acute fracture or subluxation is identified.  No radiopaque foreign body or soft tissue calcification is identified.  IMPRESSION:  1.  No acute findings. 2.  Previous ACL repair.   Original Report Authenticated By: Signa Kell, M.D.      No diagnosis found.  New onset of knee pain today.  Patient states he feels like his knee gave away while walking on a fire scene.  Prior ACL repair.  Radiology results reviewed, no acute injury or effusion noted. Significant tenderness to lateral aspect of knee.  No instability noted on exam.  Distal PMS intact.  Knee immobilizer, crutches.  Follow-up with his orthopedist Eulah Pont).  MDM    I personally performed the services described in this documentation, which was scribed in my presence. The recorded information has been reviewed and is accurate.        Jimmye Norman, NP 10/21/12 [phone]

## 2012-10-20 NOTE — ED Notes (Signed)
NP at bedside.

## 2012-10-20 NOTE — ED Notes (Addendum)
Pt in fire dept, was walking and right knee "buckled." Pt has hx of right knee reconstruction. Pt states he was unable to walk. After injury. Redness and swelling noted to right knee, no obvious deformity. Pulse present, 2+

## 2012-10-21 NOTE — ED Provider Notes (Signed)
  Medical screening examination/treatment/procedure(s) were performed by non-physician practitioner and as supervising physician I was immediately available for consultation/collaboration.    Gerhard Munch, MD 10/21/12 [phone]

## 2012-10-24 IMAGING — CR DG CHEST 2V
2 series · 2 of 2 positions shown · non-contrast
Comparison: 05/07/2005

CLINICAL DATA: Chest pain

CHEST - 2 VIEW

[w chest pa]
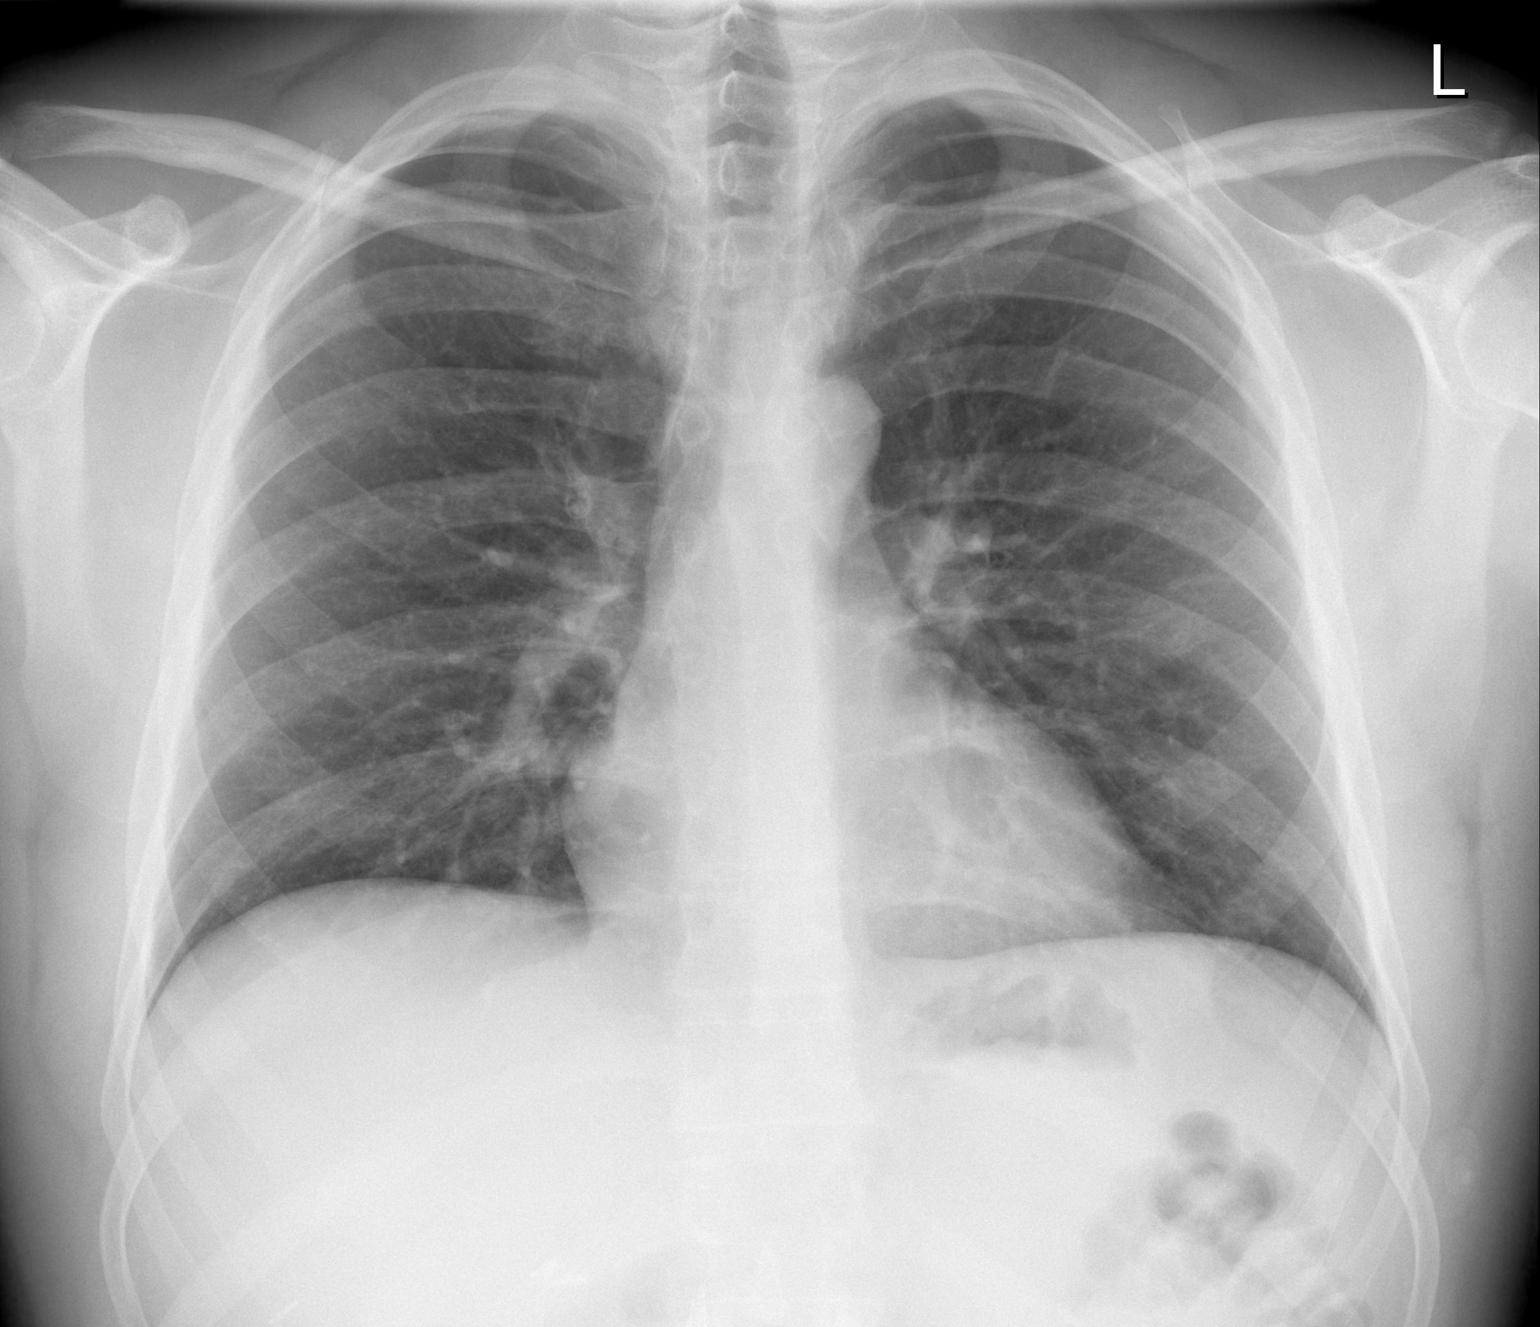

[w chest lat]
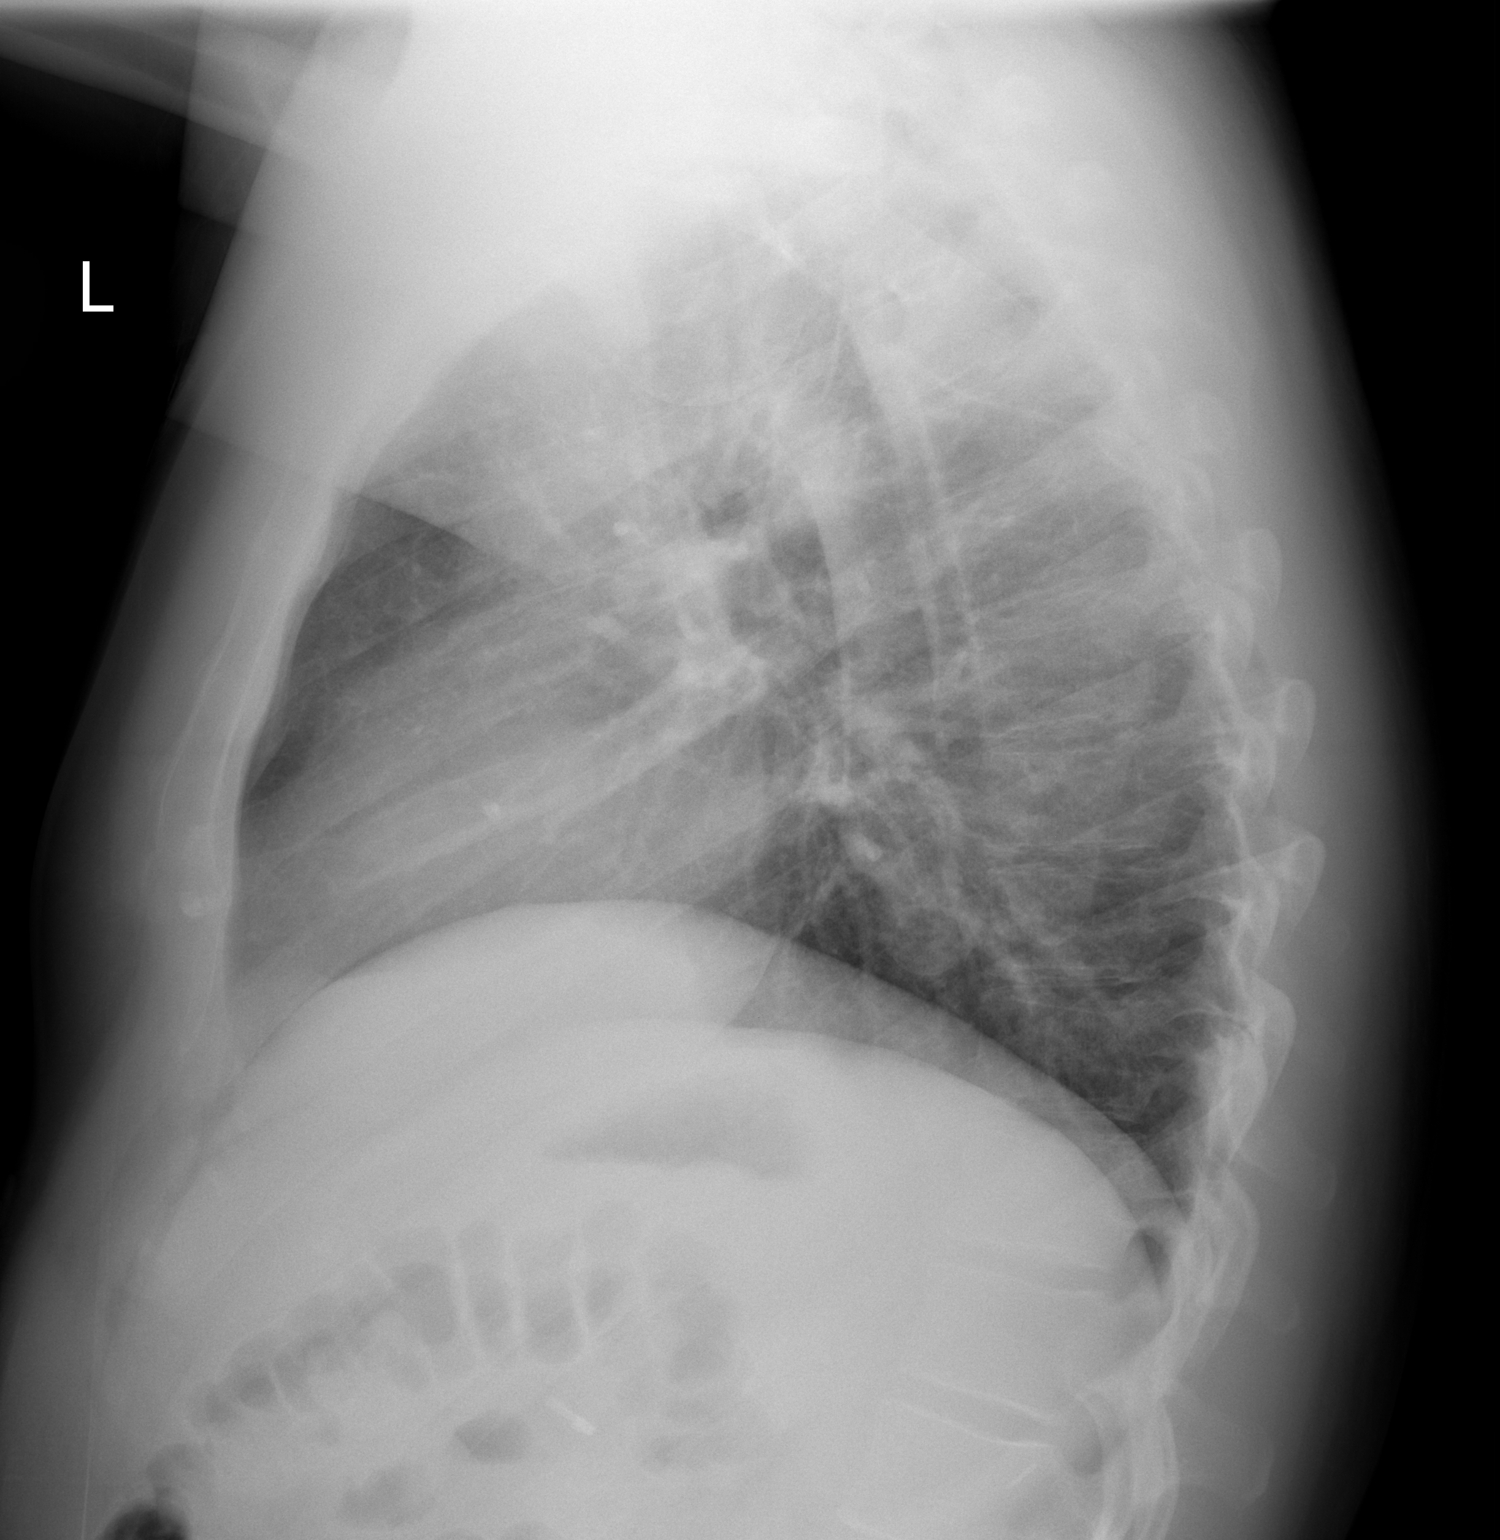

[2 of 2 positions shown; findings below may reference images not displayed]

FINDINGS: Vascular clips in the right upper abdomen. Lungs clear.
Heart size and pulmonary vascularity normal.  No effusion.
Visualized bones unremarkable.
IMPRESSION: No acute disease

## 2013-04-28 IMAGING — CR DG KNEE COMPLETE 4+V*R*
4 series · 4 of 4 positions shown · non-contrast
Comparison: None.

CLINICAL DATA: Recent fall.  Pain.

RIGHT KNEE - COMPLETE 4+ VIEW

[view not recorded (1 of 4)]
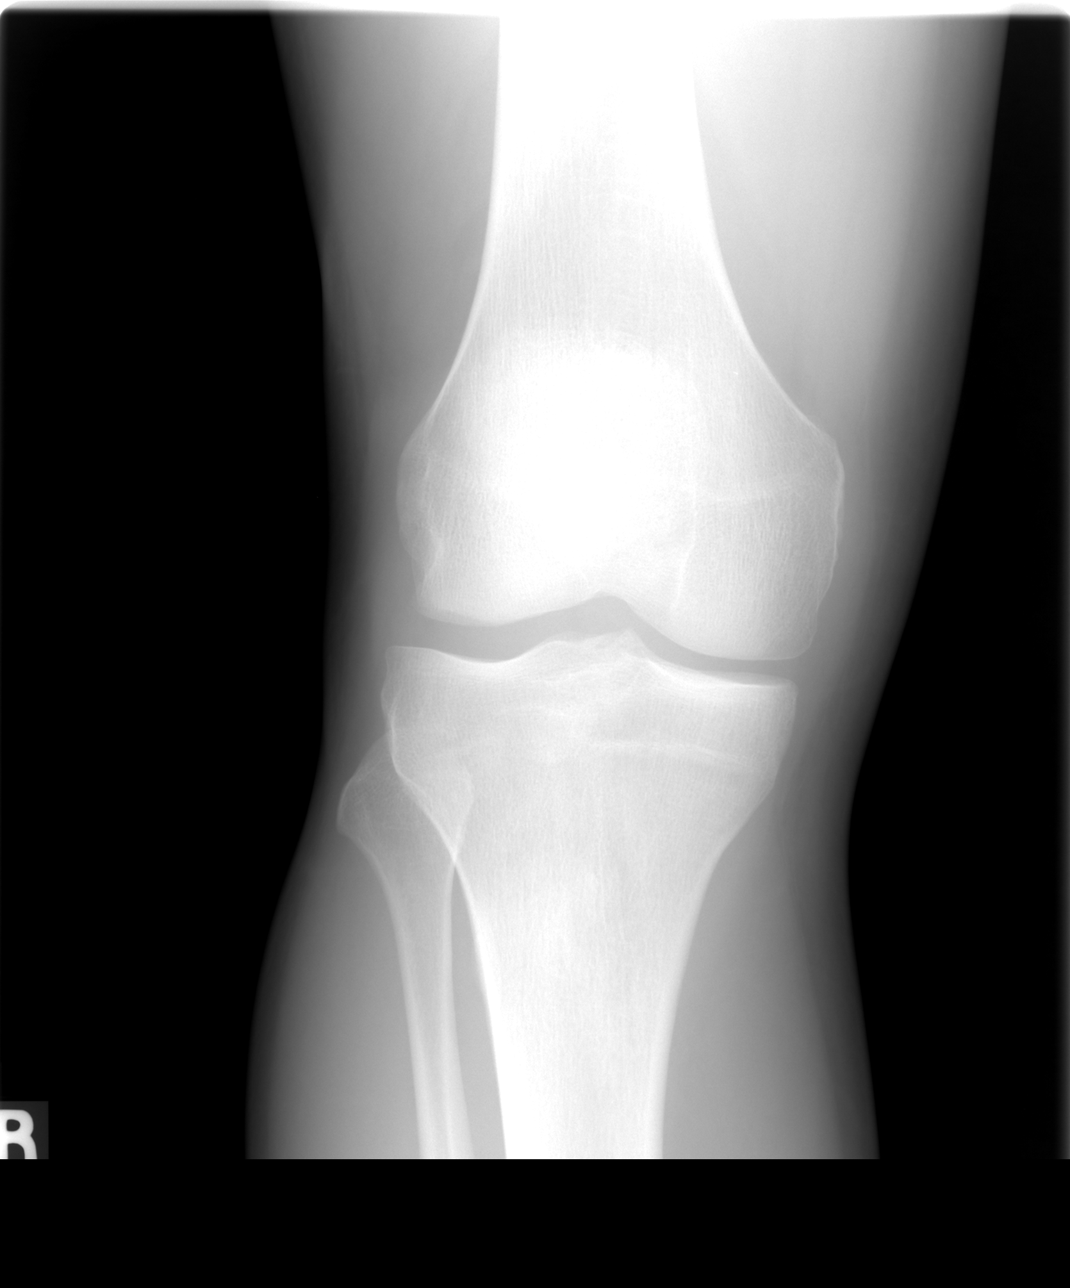

[view not recorded (2 of 4)]
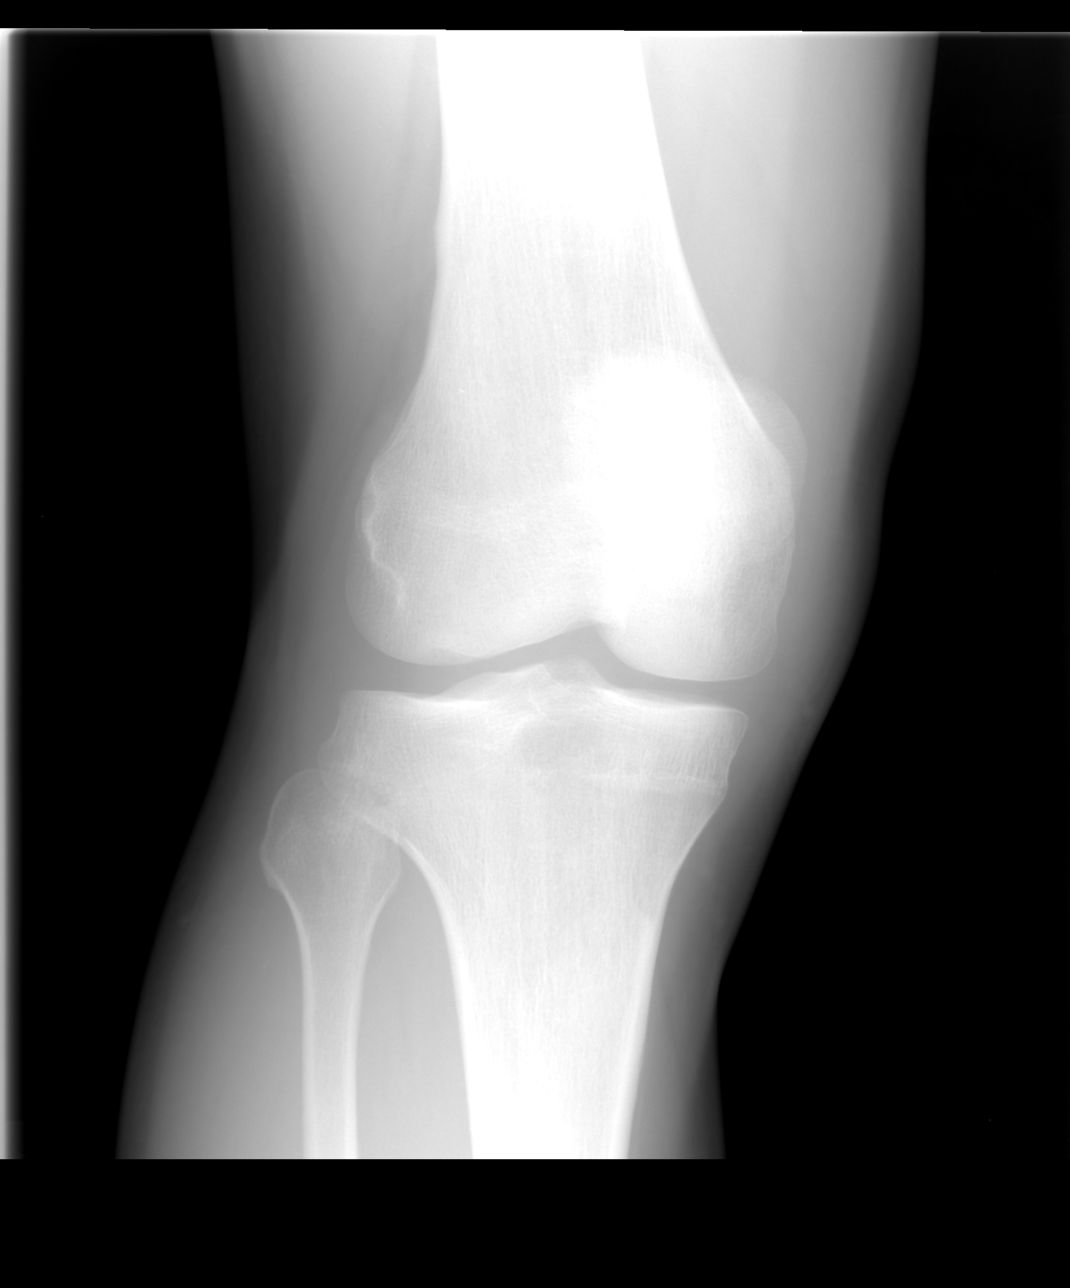

[view not recorded (3 of 4)]
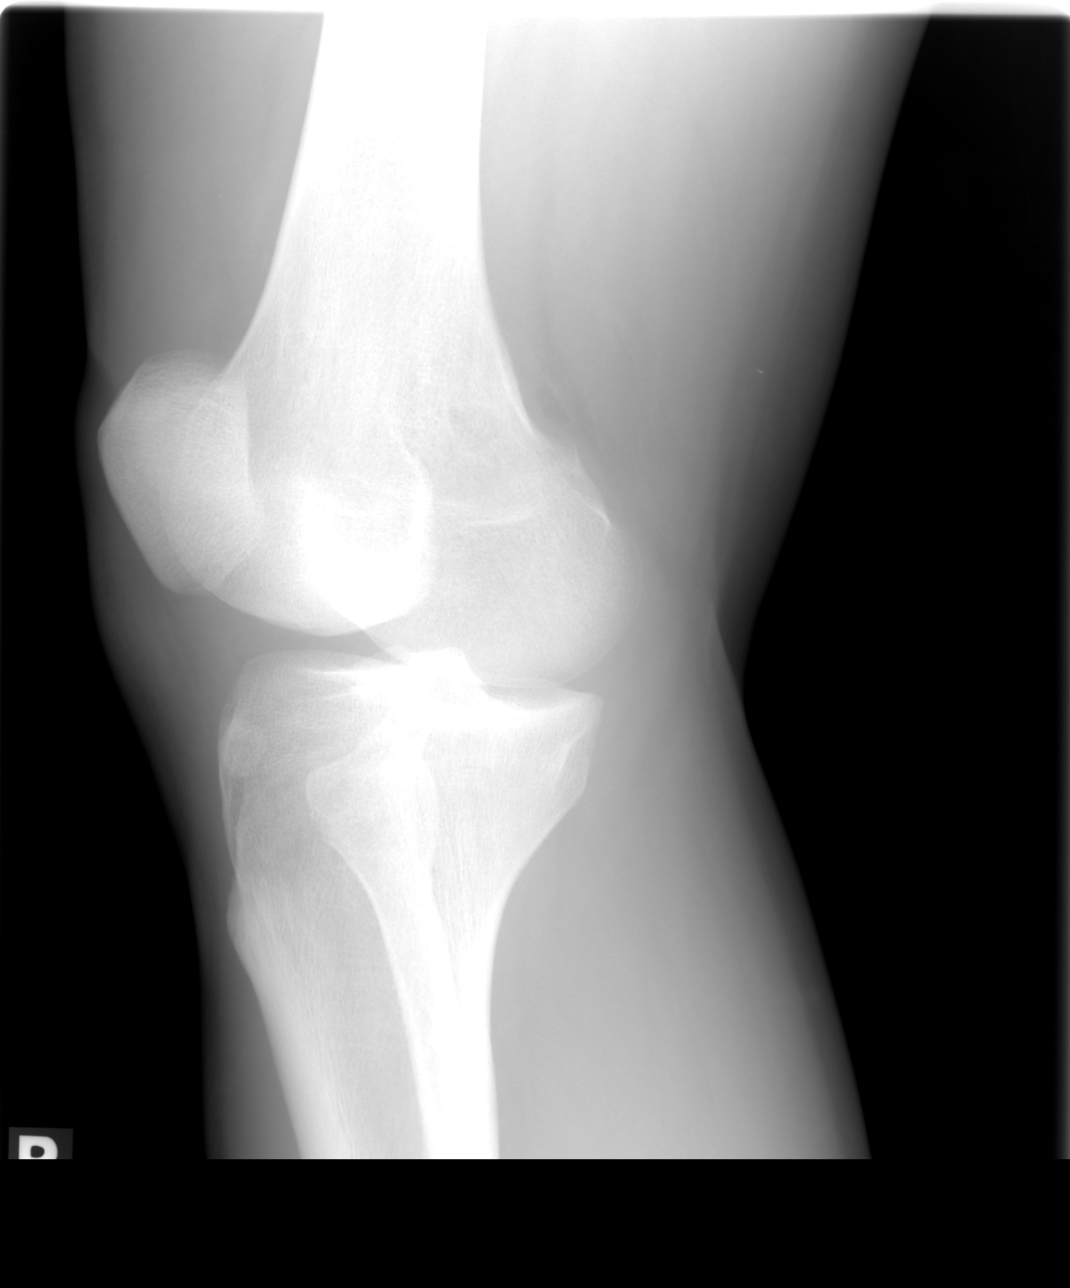

[view not recorded (4 of 4)]
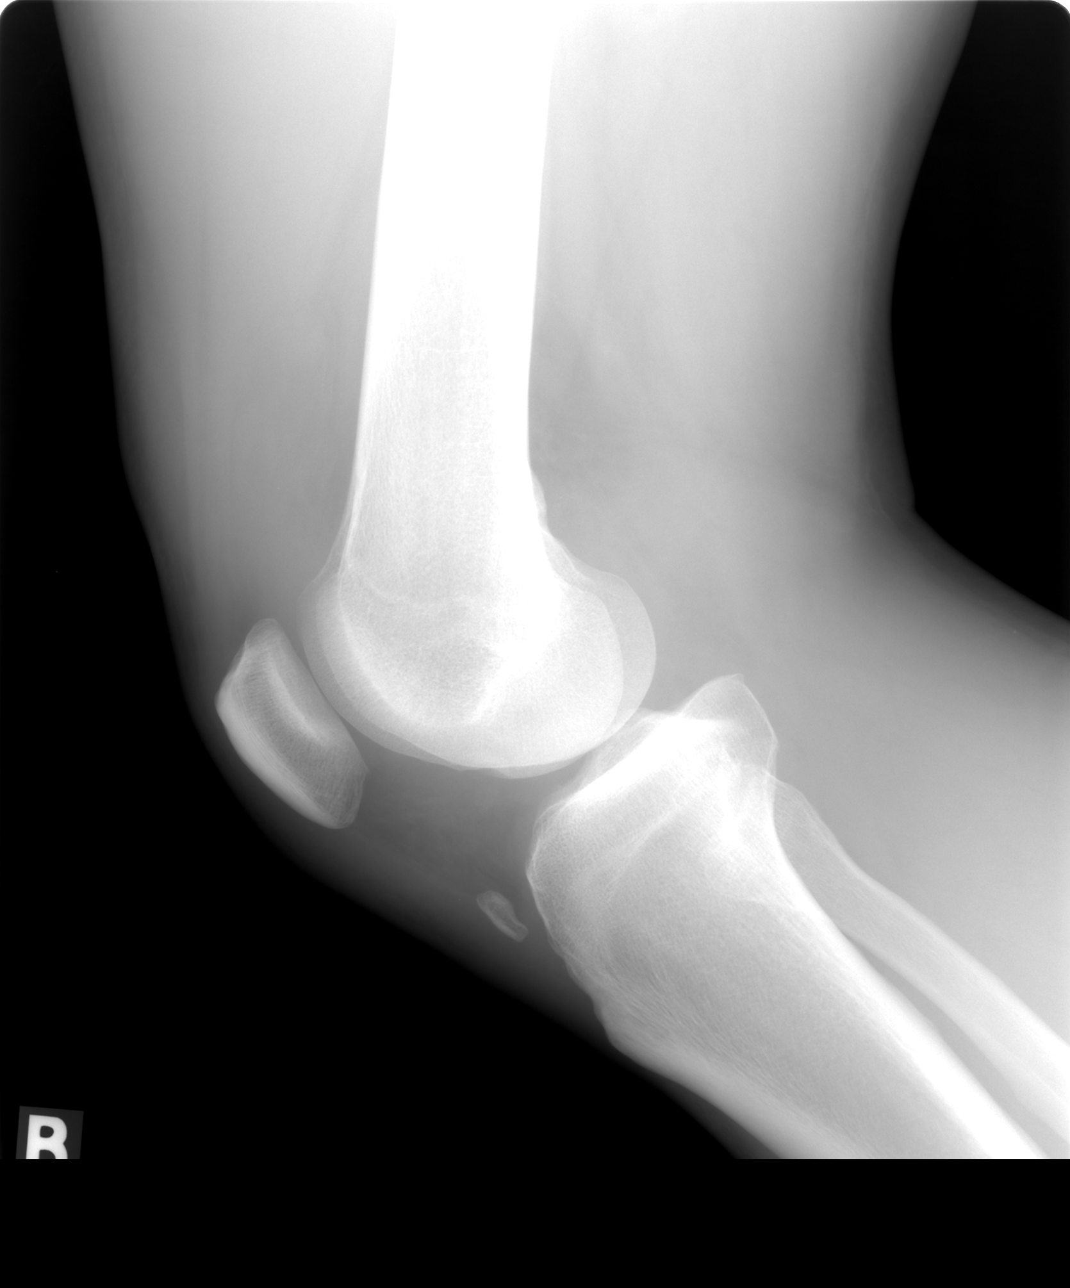

[4 of 4 positions shown; findings below may reference images not displayed]

FINDINGS: There is a well corticated ossific density seen anterior
and superior to the anterior tibial tuberosity consistent with an
ununited secondary ossification center (normal variant).  There are
no fractures or dislocations.  There is a possible joint effusion
or hemarthrosis present with distention of the suprapatellar pouch.
IMPRESSION: Possible joint effusion or hemarthrosis. Ununited secondary
ossification center (normal variant) associated with the anterior
tibial tuberosity.

## 2013-09-09 ENCOUNTER — Emergency Department (HOSPITAL_COMMUNITY): Payer: Worker's Compensation

## 2013-09-09 ENCOUNTER — Encounter (HOSPITAL_COMMUNITY): Payer: Self-pay | Admitting: Emergency Medicine

## 2013-09-09 ENCOUNTER — Emergency Department (HOSPITAL_COMMUNITY)
Admission: EM | Admit: 2013-09-09 | Discharge: 2013-09-09 | Disposition: A | Discharge status: Home / Self Care | Payer: Worker's Compensation | Attending: Emergency Medicine | Admitting: Emergency Medicine

## 2013-09-09 DIAGNOSIS — S99929A Unspecified injury of unspecified foot, initial encounter: Secondary | ICD-10-CM

## 2013-09-09 DIAGNOSIS — Y9289 Other specified places as the place of occurrence of the external cause: Secondary | ICD-10-CM | POA: Insufficient documentation

## 2013-09-09 DIAGNOSIS — W108XXA Fall (on) (from) other stairs and steps, initial encounter: Secondary | ICD-10-CM | POA: Insufficient documentation

## 2013-09-09 DIAGNOSIS — W010XXA Fall on same level from slipping, tripping and stumbling without subsequent striking against object, initial encounter: Secondary | ICD-10-CM | POA: Insufficient documentation

## 2013-09-09 DIAGNOSIS — M25551 Pain in right hip: Secondary | ICD-10-CM

## 2013-09-09 DIAGNOSIS — S99919A Unspecified injury of unspecified ankle, initial encounter: Secondary | ICD-10-CM

## 2013-09-09 DIAGNOSIS — S79929A Unspecified injury of unspecified thigh, initial encounter: Secondary | ICD-10-CM

## 2013-09-09 DIAGNOSIS — S59909A Unspecified injury of unspecified elbow, initial encounter: Secondary | ICD-10-CM | POA: Insufficient documentation

## 2013-09-09 DIAGNOSIS — S59919A Unspecified injury of unspecified forearm, initial encounter: Principal | ICD-10-CM

## 2013-09-09 DIAGNOSIS — S8990XA Unspecified injury of unspecified lower leg, initial encounter: Secondary | ICD-10-CM | POA: Insufficient documentation

## 2013-09-09 DIAGNOSIS — M25521 Pain in right elbow: Secondary | ICD-10-CM

## 2013-09-09 DIAGNOSIS — Y990 Civilian activity done for income or pay: Secondary | ICD-10-CM | POA: Insufficient documentation

## 2013-09-09 DIAGNOSIS — Z79899 Other long term (current) drug therapy: Secondary | ICD-10-CM | POA: Insufficient documentation

## 2013-09-09 DIAGNOSIS — F4310 Post-traumatic stress disorder, unspecified: Secondary | ICD-10-CM | POA: Insufficient documentation

## 2013-09-09 DIAGNOSIS — Y9389 Activity, other specified: Secondary | ICD-10-CM | POA: Insufficient documentation

## 2013-09-09 DIAGNOSIS — S79919A Unspecified injury of unspecified hip, initial encounter: Secondary | ICD-10-CM | POA: Insufficient documentation

## 2013-09-09 DIAGNOSIS — R209 Unspecified disturbances of skin sensation: Secondary | ICD-10-CM | POA: Insufficient documentation

## 2013-09-09 DIAGNOSIS — M25561 Pain in right knee: Secondary | ICD-10-CM

## 2013-09-09 DIAGNOSIS — S6990XA Unspecified injury of unspecified wrist, hand and finger(s), initial encounter: Principal | ICD-10-CM | POA: Insufficient documentation

## 2013-09-09 HISTORY — DX: Post-traumatic stress disorder, unspecified: F43.10

## 2013-09-09 MED ORDER — IBUPROFEN 600 MG PO TABS: 600.0000 mg | ORAL_TABLET | Freq: Four times a day (QID) | ORAL | Status: AC | PRN

## 2013-09-09 NOTE — ED Notes (Signed)
Reports slipping on ice and falling down several steps, hit his head on wall, denies loc. Having pain to right elbow, hip and knee. Ambulatory on arrival.

## 2013-09-09 NOTE — ED Provider Notes (Signed)
CSN: [id number]     Arrival date & time 09/09/13  1158 History   First MD Initiated Contact with Patient 09/09/13 1204     Chief Complaint  Patient presents with  . 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  Pt reports landed on right side, hitting right elbow, hip and knee. Worst pain in right elbow, aching and throbbing, 5/10, worse with palpation and movement. Pt is left handed. Reports intermittent numbness and tingling in right hand. Pain worst over olecranon process. Co-workers advised pt prior to splinting "elbow didn't look right."  Pt also concerned for right knee due to hx of ACL repair in right knee 1 year ago.  Denies hitting head or LOC. Denies pain medication PTA. Denies head, neck, or back pain. No chest or abdominal pain. No SOB.  Past Medical History  Diagnosis Date  . PTSD (post-traumatic stress disorder)    Past Surgical History  Procedure Laterality Date  . Knee surgery    . Cholecystectomy    . Nasal sinus surgery    . Eye surgery     History reviewed. No pertinent family history. History  Substance Use Topics  . Smoking status: Never Smoker   . Smokeless tobacco: Not on file  . Alcohol Use: No    Review of Systems  Musculoskeletal: Positive for arthralgias ( right elbow, hip and knee), joint swelling ( right elbow) and myalgias. Negative for back pain and neck pain.  Skin: Negative for wound.  Neurological: Negative for headaches.  All other systems reviewed and are negative.      Allergies  Review of patient's allergies indicates no known allergies.  Home Medications   Current Outpatient Rx  Name  Route  Sig  Dispense  Refill  . methylphenidate (RITALIN) 20 MG tablet   Oral   Take 20 mg by mouth 2 (two) times daily.          . Multiple Vitamin (MULTIVITAMIN WITH MINERALS) TABS tablet   Oral   Take 1 tablet by mouth daily.         Marland Kitchen. omeprazole (PRILOSEC) 40 MG capsule   Oral   Take 40 mg by mouth daily.         Marland Kitchen. ibuprofen (ADVIL,MOTRIN) 600 MG tablet   Oral   Take 1 tablet (600 mg total) by mouth every 6 (six) hours as needed.   30 tablet   0    BP 119/65  Pulse 58  Temp(Src) 97.7 F (36.5 C) (Oral)  Resp 18  Ht 5\' 11"  (1.803 m)  Wt 185 lb (83.915 kg)  BMI 25.81 kg/m2  SpO2 97% Physical Exam  Nursing note and vitals reviewed. Constitutional: He is oriented to person, place, and time. He appears well-developed and well-nourished.  HENT:  Head: Normocephalic and atraumatic.  Eyes: EOM are normal.  Neck: Normal range of motion.  Cardiovascular: Normal rate.   Pulmonary/Chest: Effort normal.  Musculoskeletal: Normal range of motion. He exhibits edema and tenderness.  Right:  -Shoulder: no deformity, mild tenderness, FROM. -Elbow: no deformity, FROM, mild edema, tenderness over olecranon process. No tenderness to medial or lateral aspect of elbow. -Hip: no deformity. mild tenderness over right hip. FROM -Knee: no deformity. No edema. Well healed surgical scars, tenderness over lateral aspect, and lateral joint space. FROM, no crepitus.   Neurovascularly in  tact. Sensation to light touch in tact.  Neurological: He is alert and oriented to person, place, and time.  Skin: Skin is warm and dry.  Skin in tact. No erythema or ecchymosis.   Psychiatric: He has a normal mood and affect. His behavior is normal.    ED Course  Procedures (including critical care time) Labs Review Labs Reviewed - No data to display Imaging Review Dg Elbow Complete Right  09/09/2013   CLINICAL DATA:  Fall.  Elbow injury and pain.  EXAM: RIGHT ELBOW - COMPLETE 3+ VIEW  COMPARISON:  None.  FINDINGS: There is no evidence of fracture, dislocation, or joint effusion. Small olecranon process bone spur noted. No  other focal bone abnormality. Soft tissues are unremarkable.  IMPRESSION: No acute findings.   Electronically Signed   By: Myles Rosenthal M.D.   On: 09/09/2013 13:30   Dg Knee Complete 4 Views Right  09/09/2013   CLINICAL DATA:  Fall.  Knee injury and pain.  EXAM: RIGHT KNEE - COMPLETE 4+ VIEW  COMPARISON:  10/20/2012  FINDINGS: There is no evidence of fracture, dislocation, or joint effusion. Postop changes from previous ACL reconstruction noted. There is no evidence of arthropathy or other focal bone abnormality. Soft tissues are unremarkable.  IMPRESSION: Prior ACL reconstruction.  No acute findings.   Electronically Signed   By: Myles Rosenthal M.D.   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 Denies hitting head, LOC, head, neck, back, chest, or abdominal pain.  Worst pain is in right elbow, hip, and knee. Pt concerned for knee due to previous ACL repair.  Low concern for dislocations or fractures. Pt is neurovascularly in tact, sensation in tact to light touch.    Plain films    Right elbow: no acute findings  Right knee: Prior ACL reconstruction. No acute findings.  Discussed results with pt.  Offered sling and knee sleeve, pt declined. Offered work note. Pt declined stating he would like to return to work if he can.  Will discharge home. Advised to f/u with PCP this week if symptoms not improving. RICE therapy discussed. Return precautions provided. Pt verbalized understanding and agreement with tx plan.      Junius Finner, PA-C 09/09/13 1523

## 2013-09-09 NOTE — ED Notes (Addendum)
Patient transported to X-ray 

## 2013-09-13 NOTE — ED Provider Notes (Signed)
Medical screening examination/treatment/procedure(s) were performed by non-physician practitioner and as supervising physician I was immediately available for consultation/collaboration.  EKG Interpretation   None         Raeford RazorStephen Pragya Lofaso, MD 09/13/13 1438

## 2013-09-30 IMAGING — US US EXTREM LOW*R* COMPLETE
1 series · 10 of 10 positions shown · non-contrast
Comparison: 03/15/2011 plain radiographs

CLINICAL DATA: History of right knee reconstructive surgery,
decreased range of motion, evaluate for Baker's cyst.

ULTRASOUND RIGHT LOWER EXTREMITY LIMITED
TECHNIQUE: Ultrasound examination of the region of interest in the
right lower extremity was performed.

[Series 1: us extrem low*right* complete · 0.09mm/px · 10 of 10 slices shown]
[im 1/10]
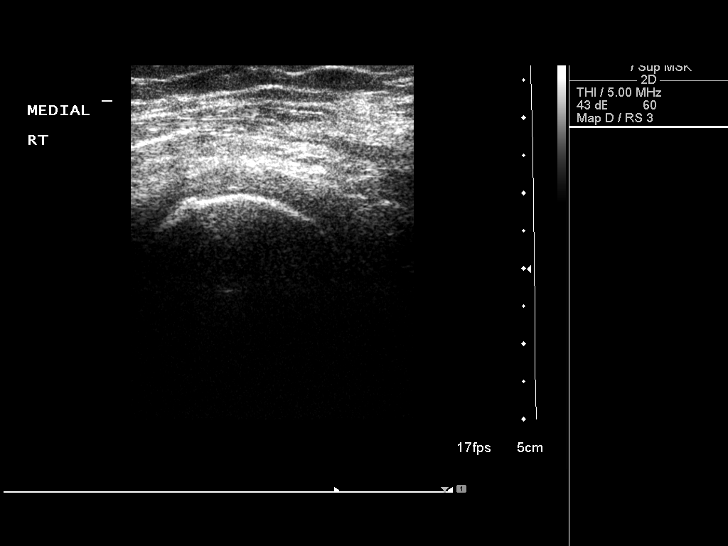
[im 2/10]
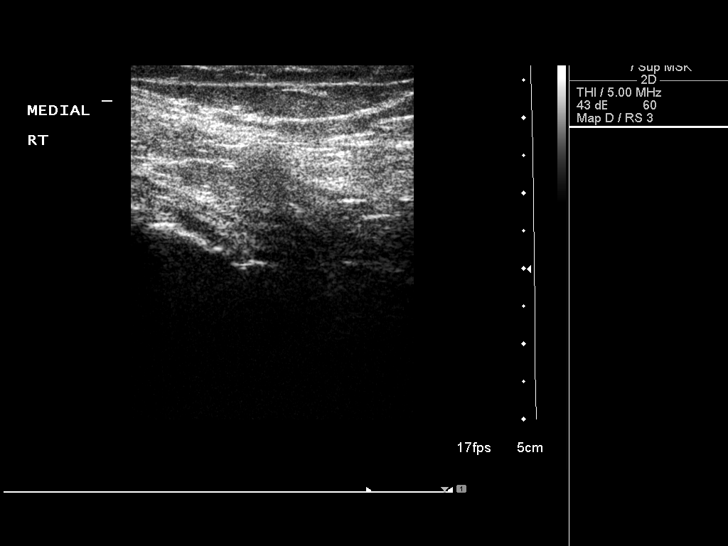
[im 3/10]
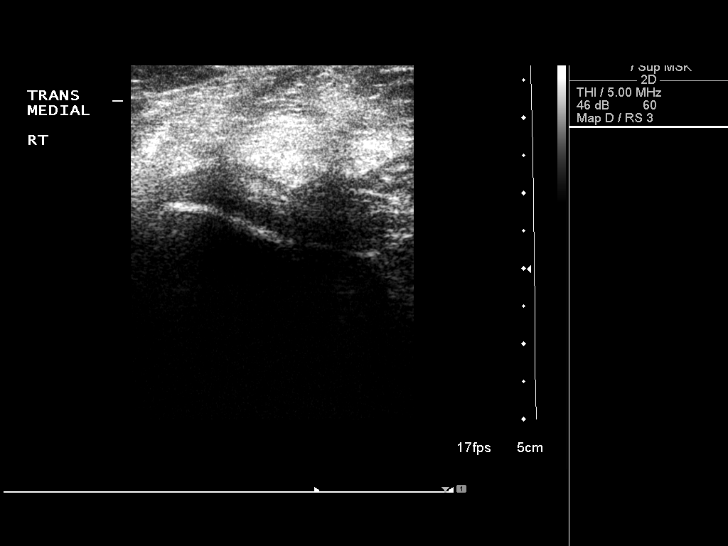
[im 4/10]
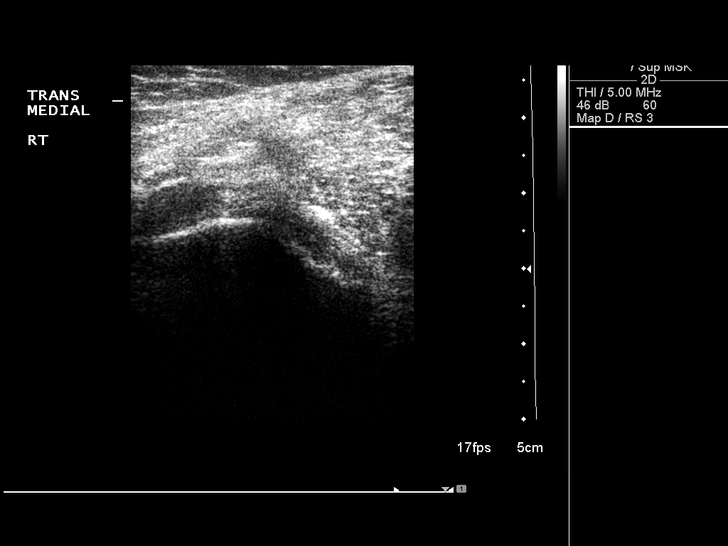
[im 5/10]
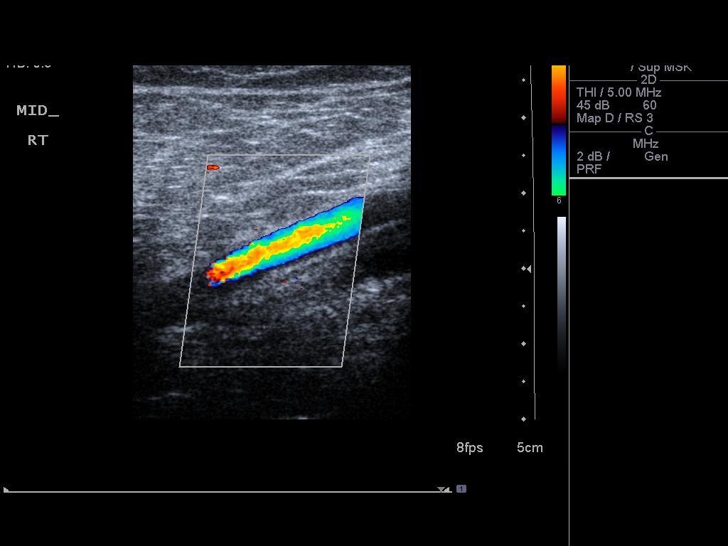
[im 6/10]
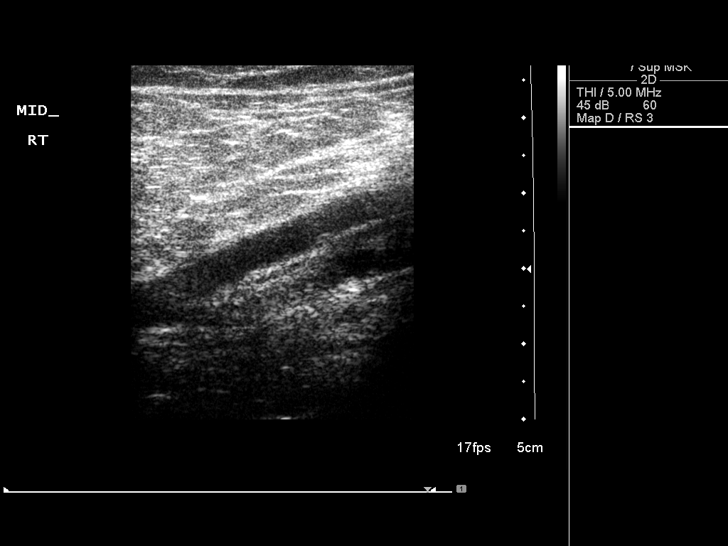
[im 7/10]
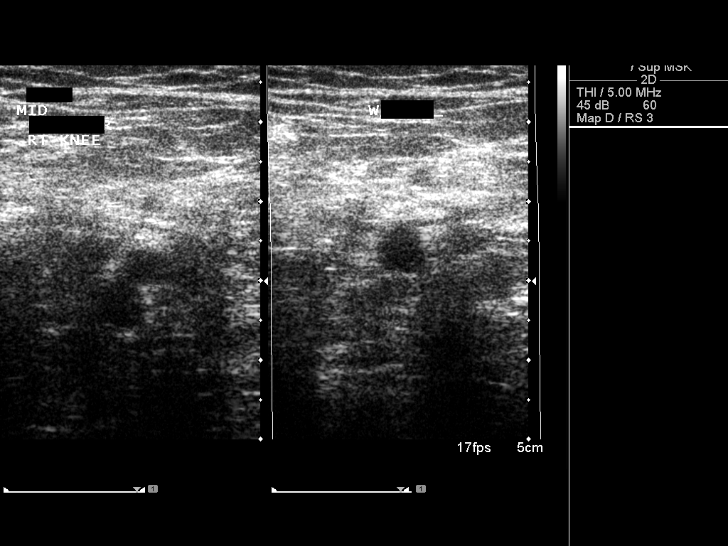
[im 8/10]
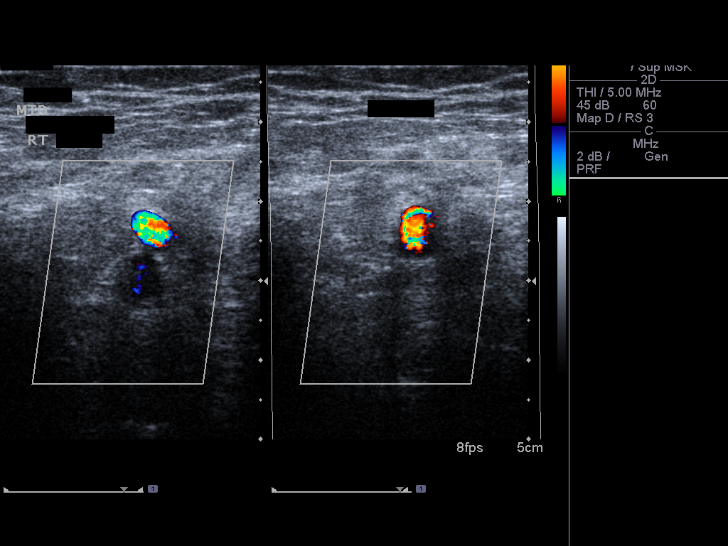
[im 9/10]
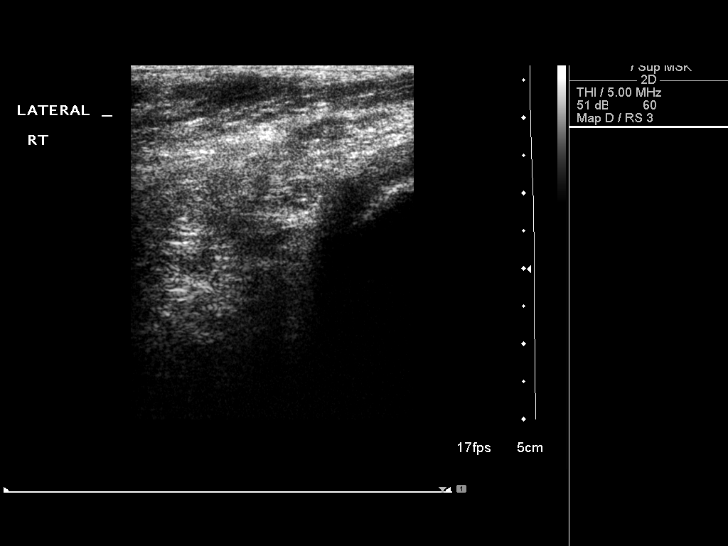
[im 10/10]
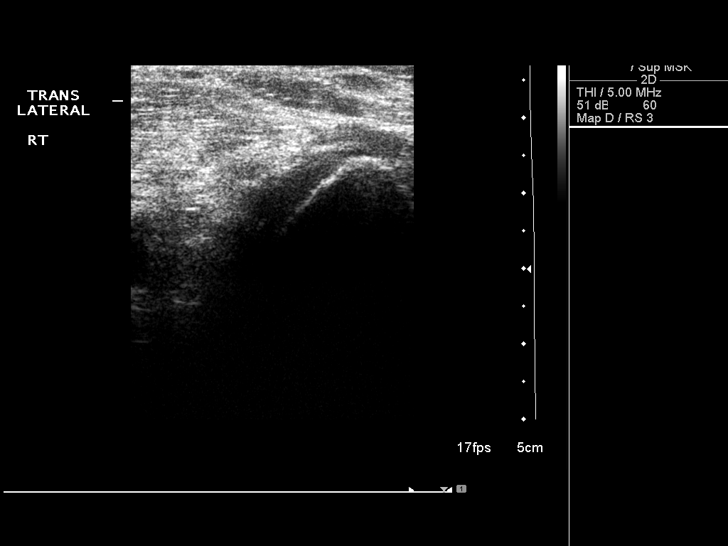

[10 of 10 positions shown; findings below may reference images not displayed]

FINDINGS: Limited ultrasound performed of the right knee popliteal
fossa.  Negative for popliteal or Baker's cyst.  Popliteal artery
and vein are patent.  Imaging of the patellar region demonstrates a
small knee joint effusion.
IMPRESSION: Negative for Baker's cyst.  Small right knee joint effusion.

## 2014-12-04 IMAGING — CR DG KNEE COMPLETE 4+V*R*
4 series · 4 of 4 positions shown · non-contrast
Comparison: 03/15/2011

CLINICAL DATA: Knee injury

RIGHT KNEE - COMPLETE 4+ VIEW

[t knee ap right]
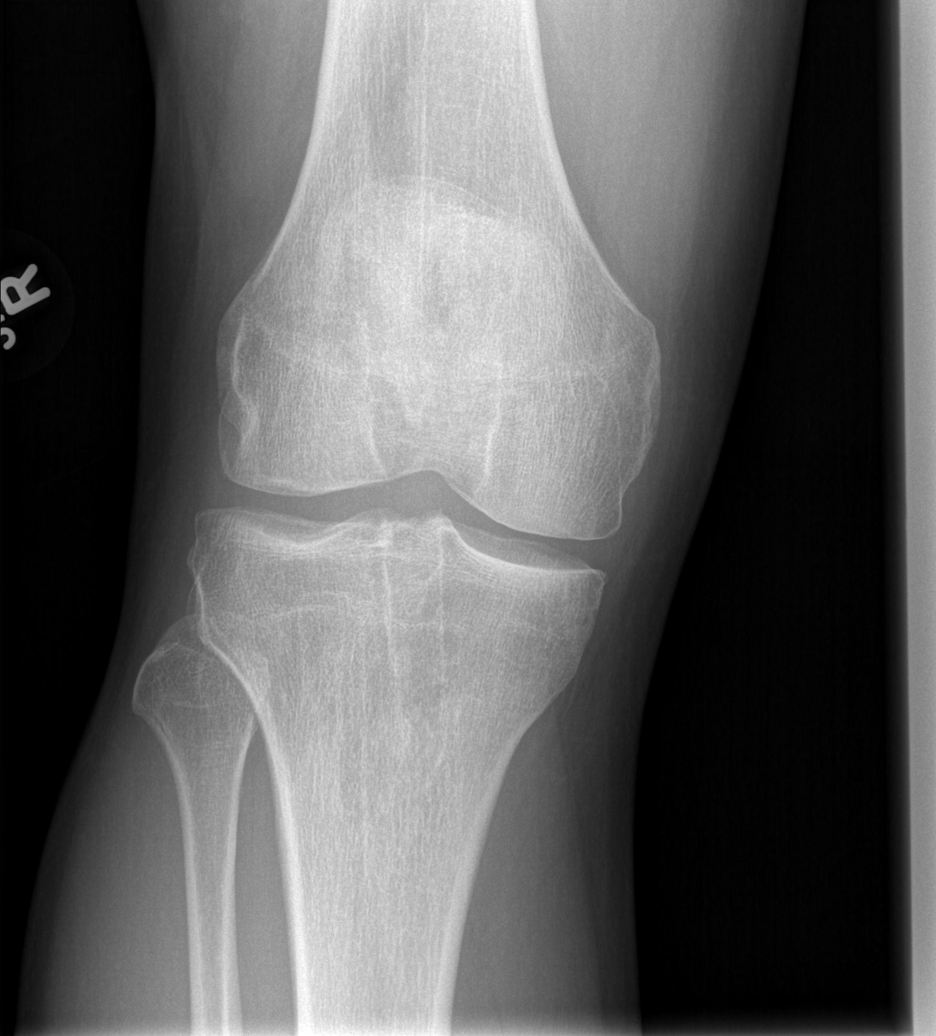

[t knee oblique right (1 of 2)]
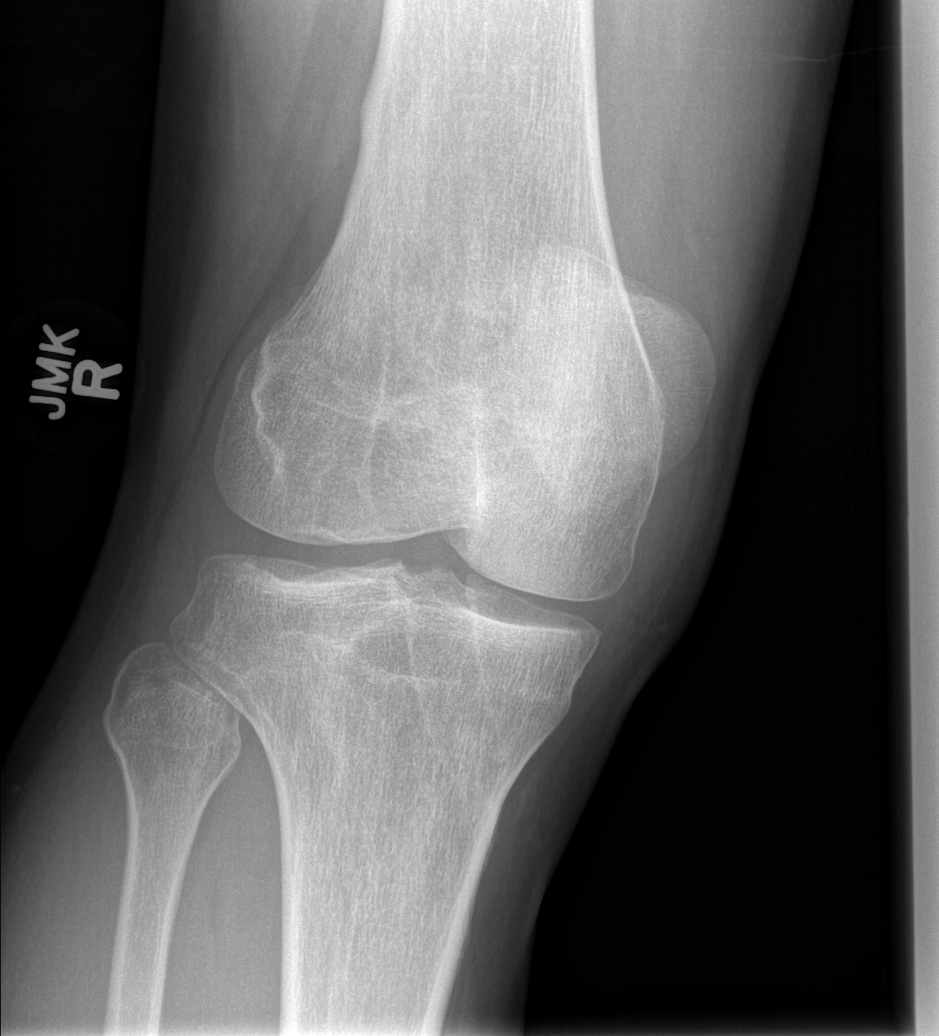

[t knee oblique right (2 of 2)]
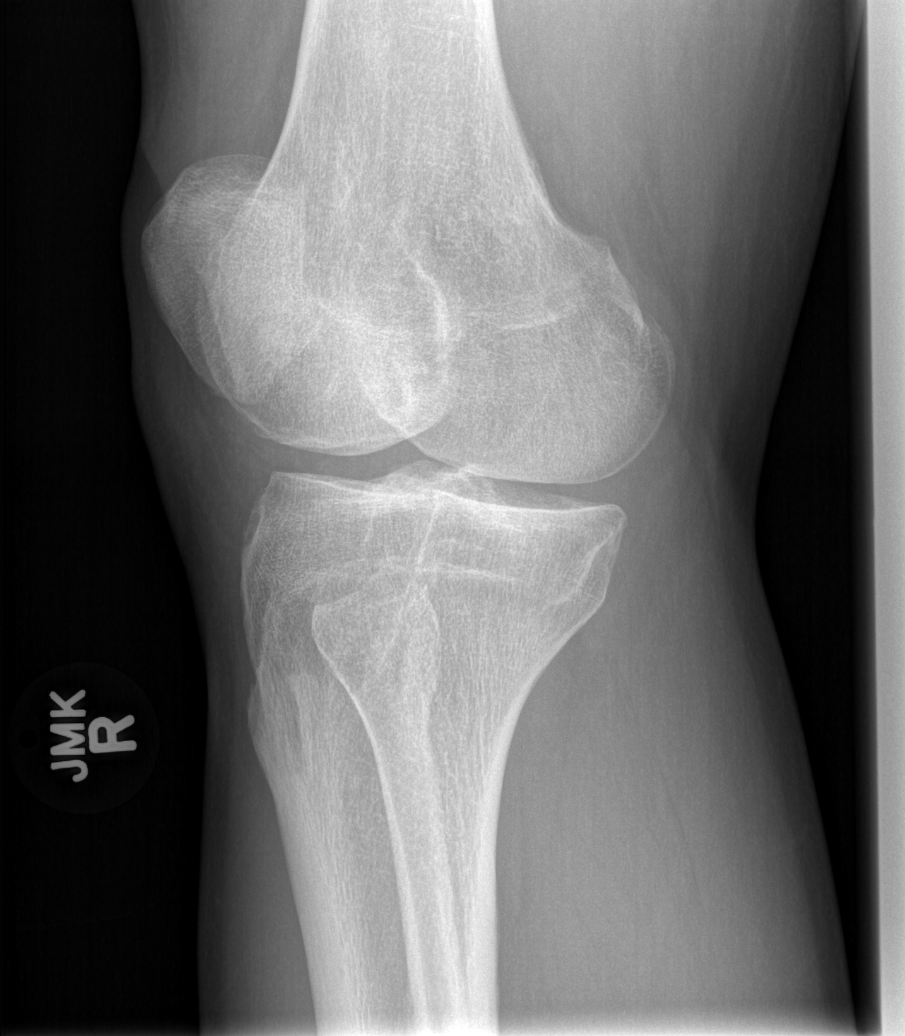

[t knee lat right]
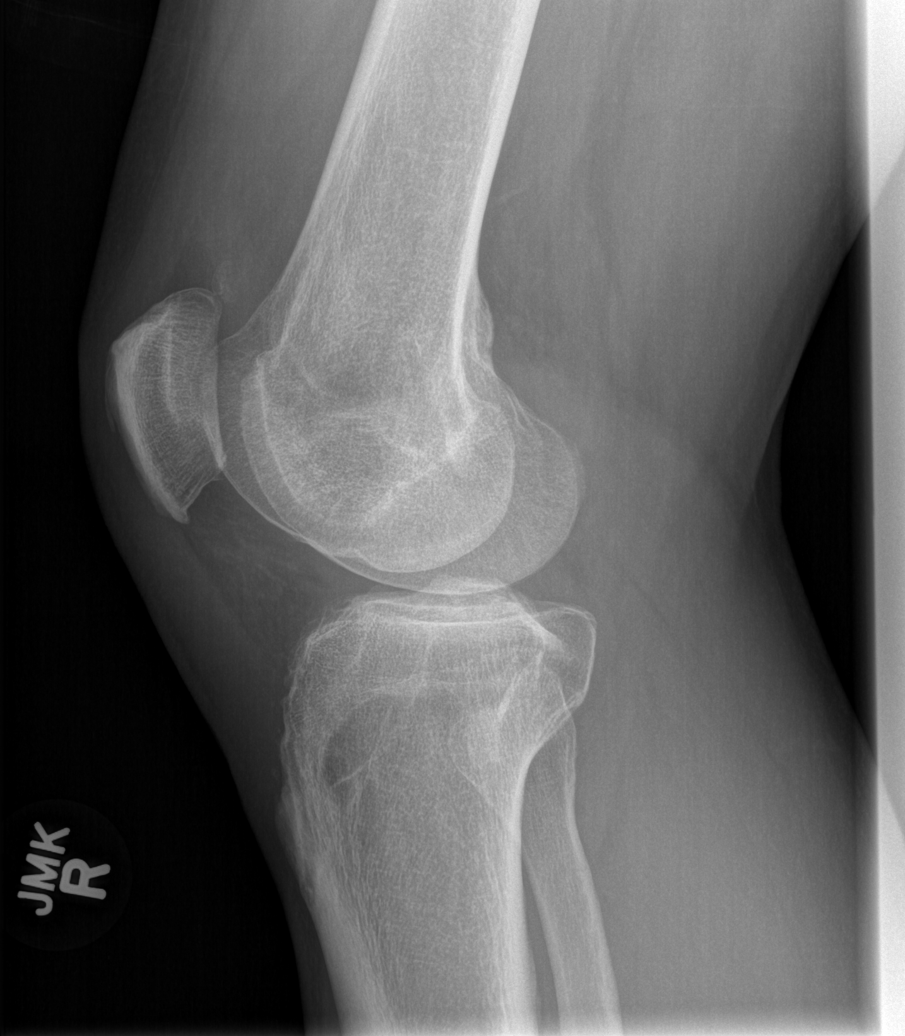

[4 of 4 positions shown; findings below may reference images not displayed]

FINDINGS: Previous ACL repair.  There is no joint effusion.  No
acute fracture or subluxation is identified.  No radiopaque foreign
body or soft tissue calcification is identified.
IMPRESSION: 1.  No acute findings.
2.  Previous ACL repair.

## 2015-06-16 ENCOUNTER — Ambulatory Visit
Admission: RE | Admit: 2015-06-16 | Discharge: 2015-06-16 | Disposition: A | Discharge status: Home / Self Care | Payer: No Typology Code available for payment source | Source: Ambulatory Visit | Attending: Occupational Medicine | Admitting: Occupational Medicine

## 2015-06-16 ENCOUNTER — Other Ambulatory Visit: Payer: Self-pay | Admitting: Occupational Medicine

## 2015-06-16 DIAGNOSIS — F4310 Post-traumatic stress disorder, unspecified: Secondary | ICD-10-CM

## 2015-10-24 IMAGING — CR DG ELBOW COMPLETE 3+V*R*
4 series · 4 of 4 positions shown · non-contrast
Comparison: None.

CLINICAL DATA: Fall.  Elbow injury and pain.

EXAM:
RIGHT ELBOW - COMPLETE 3+ VIEW

[x elbow joint ap right]
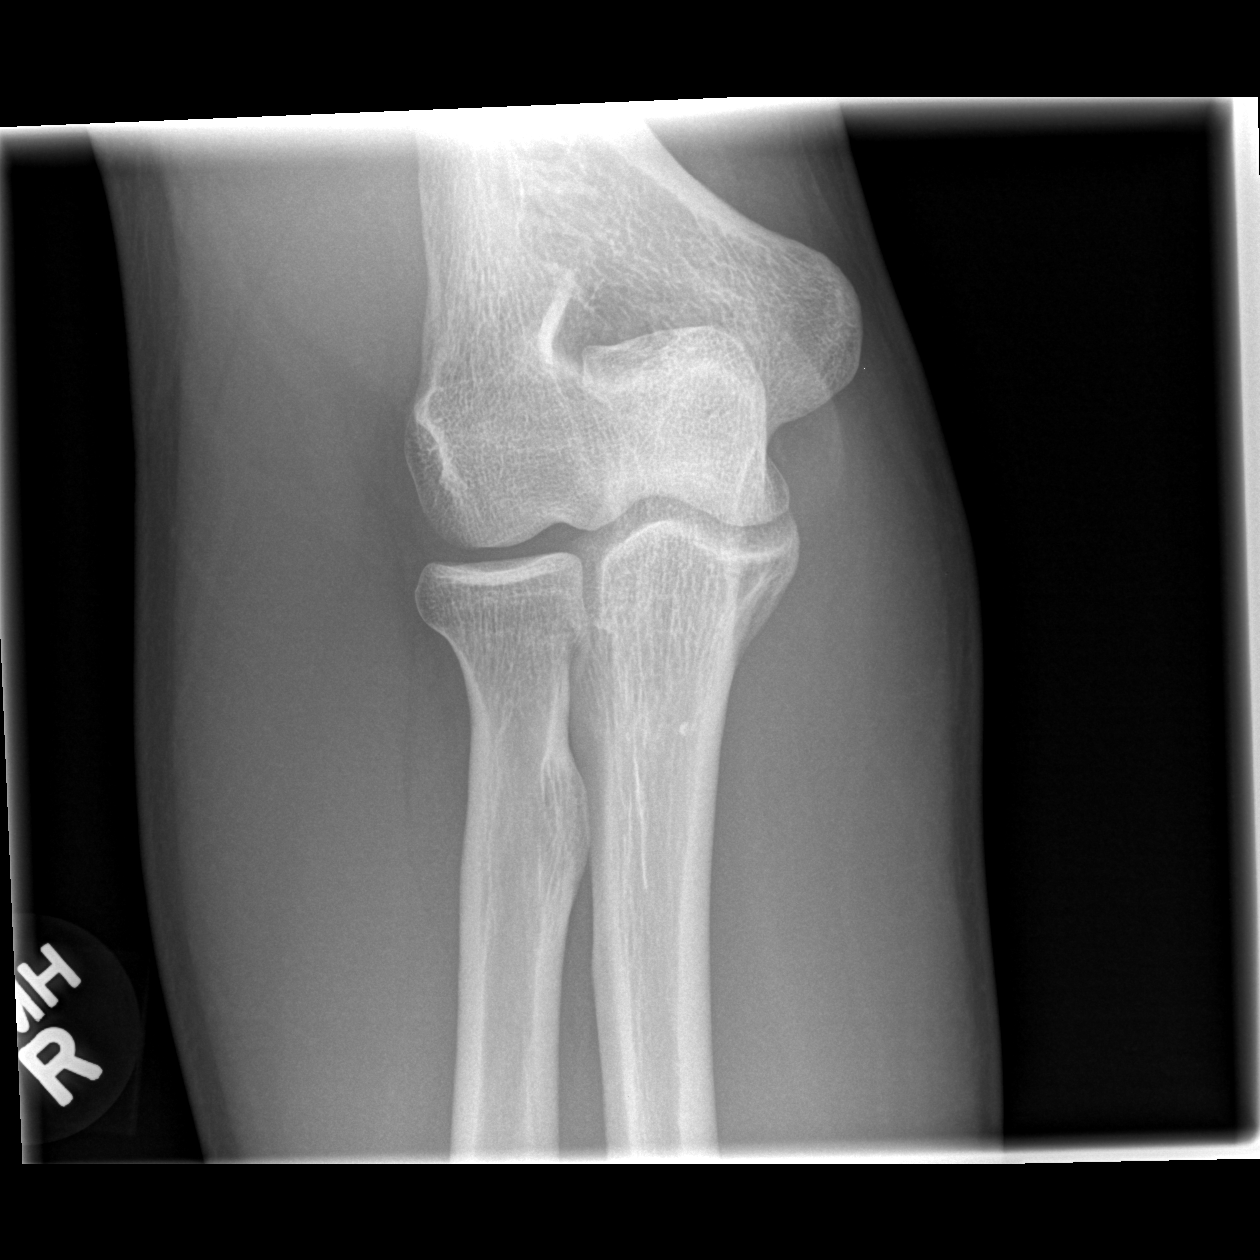

[x elbow joint obl. right (1 of 2)]
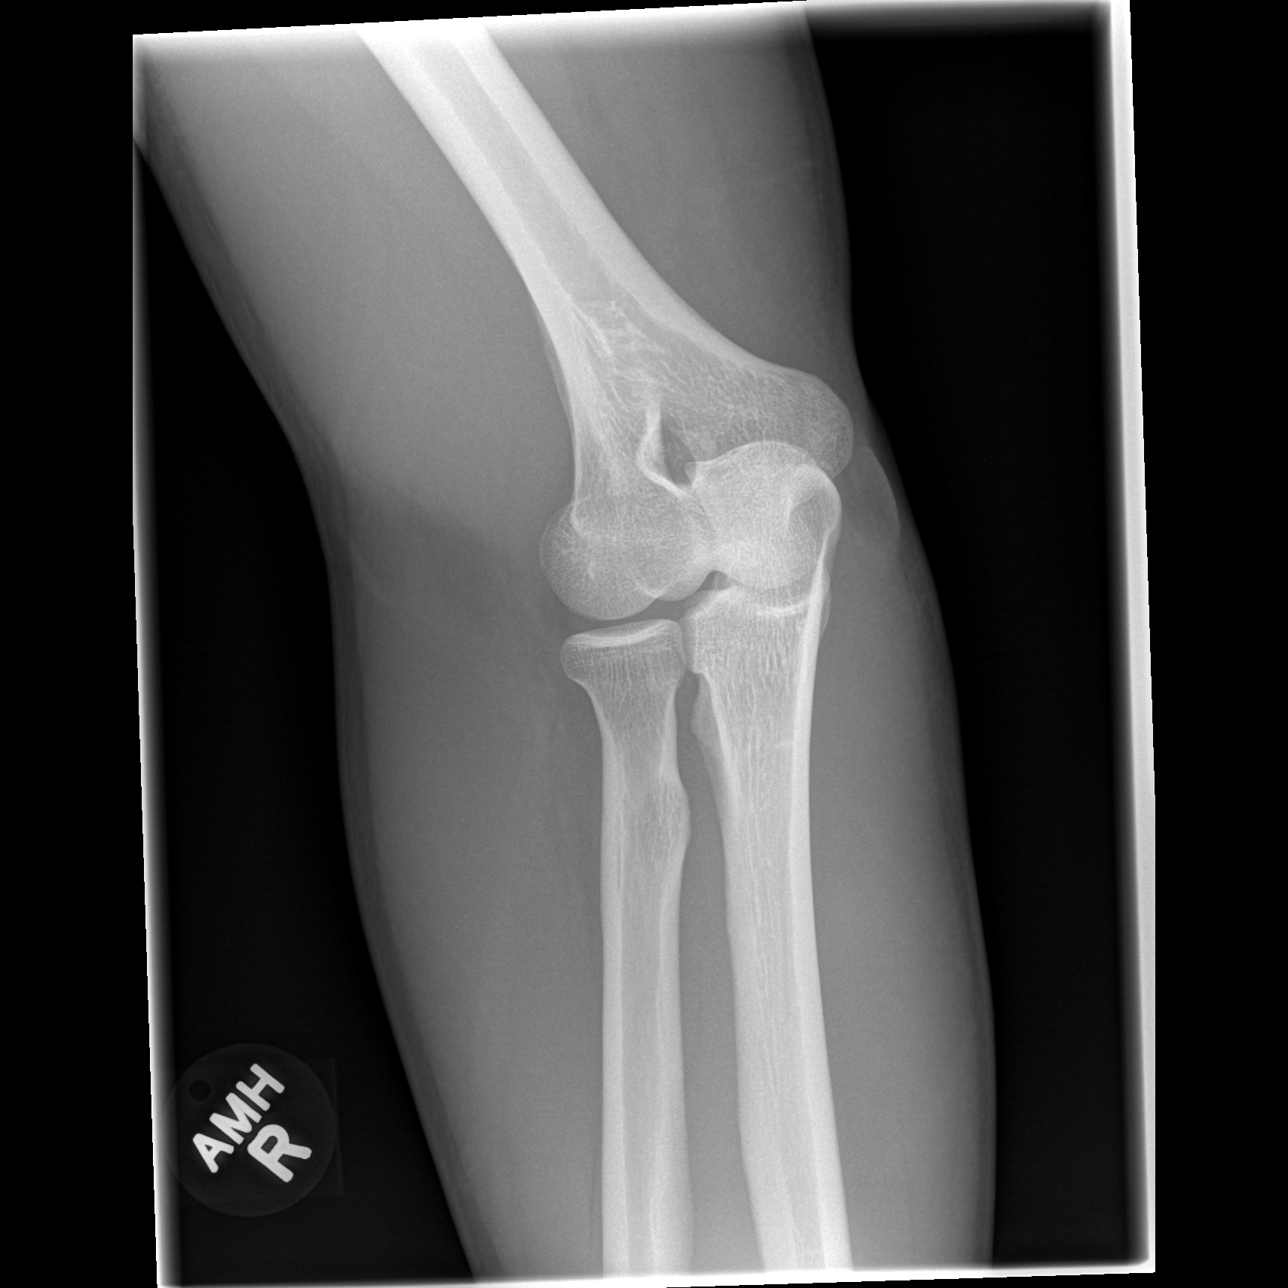

[x elbow joint obl. right (2 of 2)]
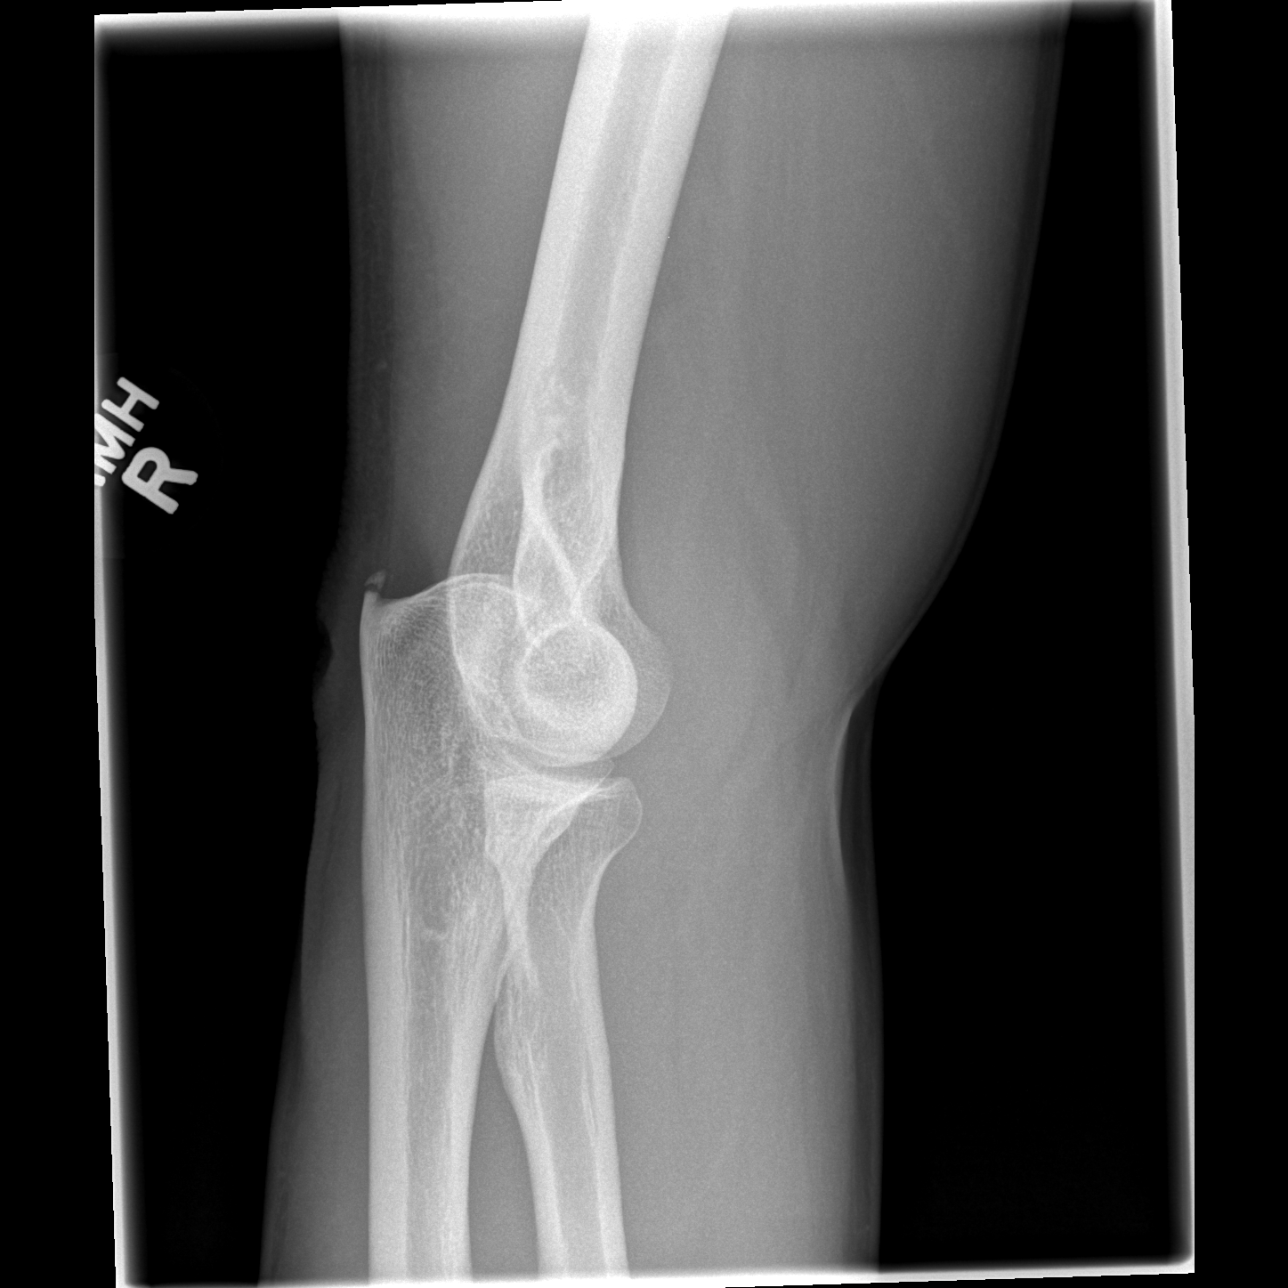

[x elbow joint lat right]
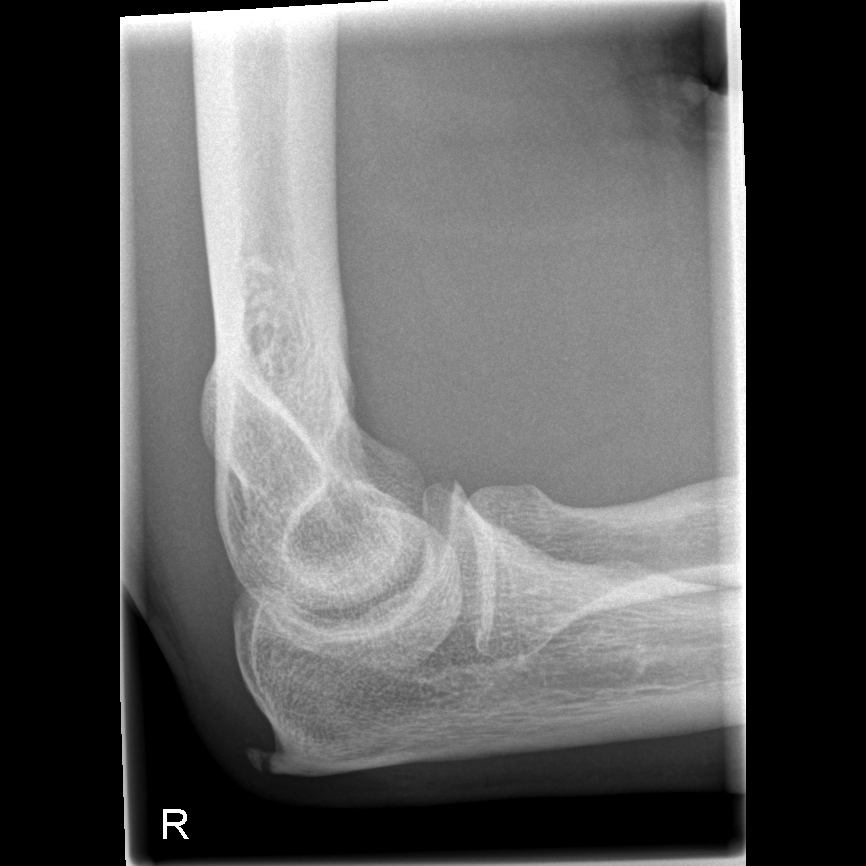

[4 of 4 positions shown; findings below may reference images not displayed]

FINDINGS: There is no evidence of fracture, dislocation, or joint effusion.
Small olecranon process bone spur noted. No other focal bone
abnormality. Soft tissues are unremarkable.
IMPRESSION: No acute findings.

## 2015-10-24 IMAGING — CR DG KNEE COMPLETE 4+V*R*
4 series · 4 of 4 positions shown · non-contrast
Comparison: 10/20/2012

CLINICAL DATA: Fall.  Knee injury and pain.

EXAM:
RIGHT KNEE - COMPLETE 4+ VIEW

[t knee ap right]
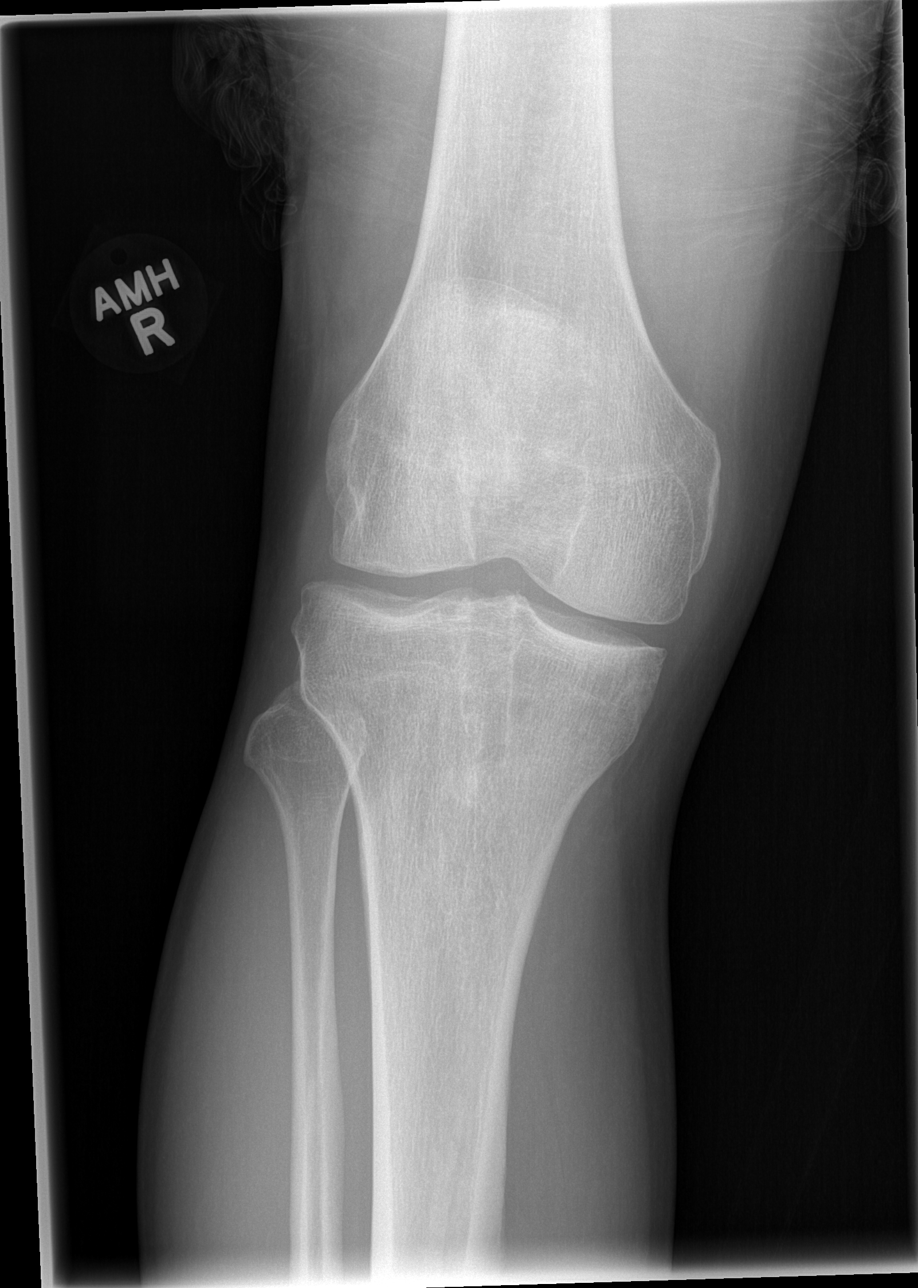

[t knee oblique right (1 of 2)]
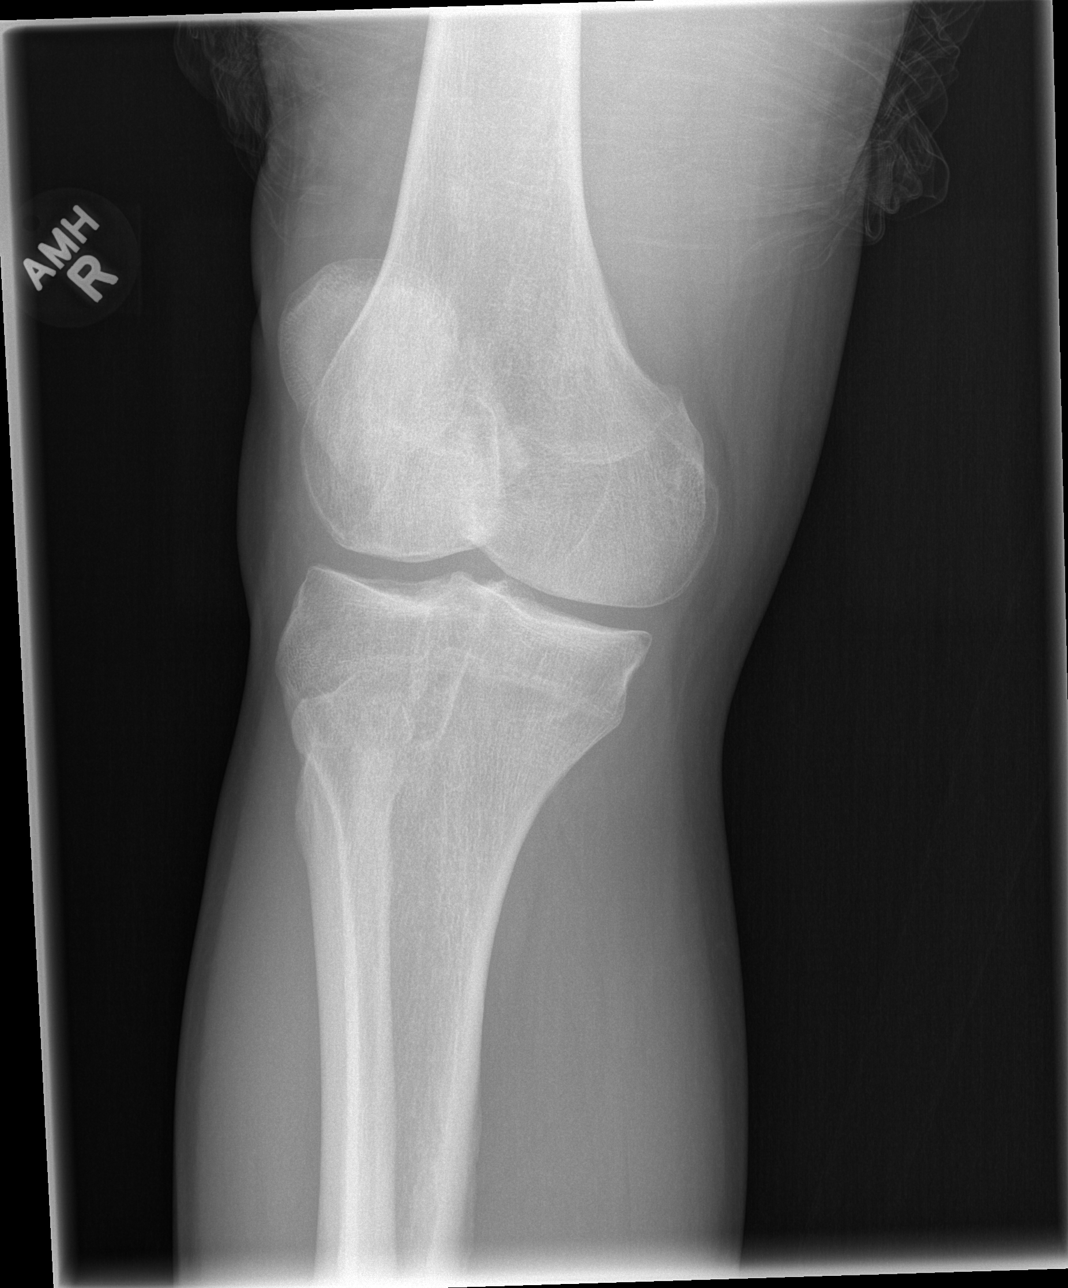

[t knee oblique right (2 of 2)]
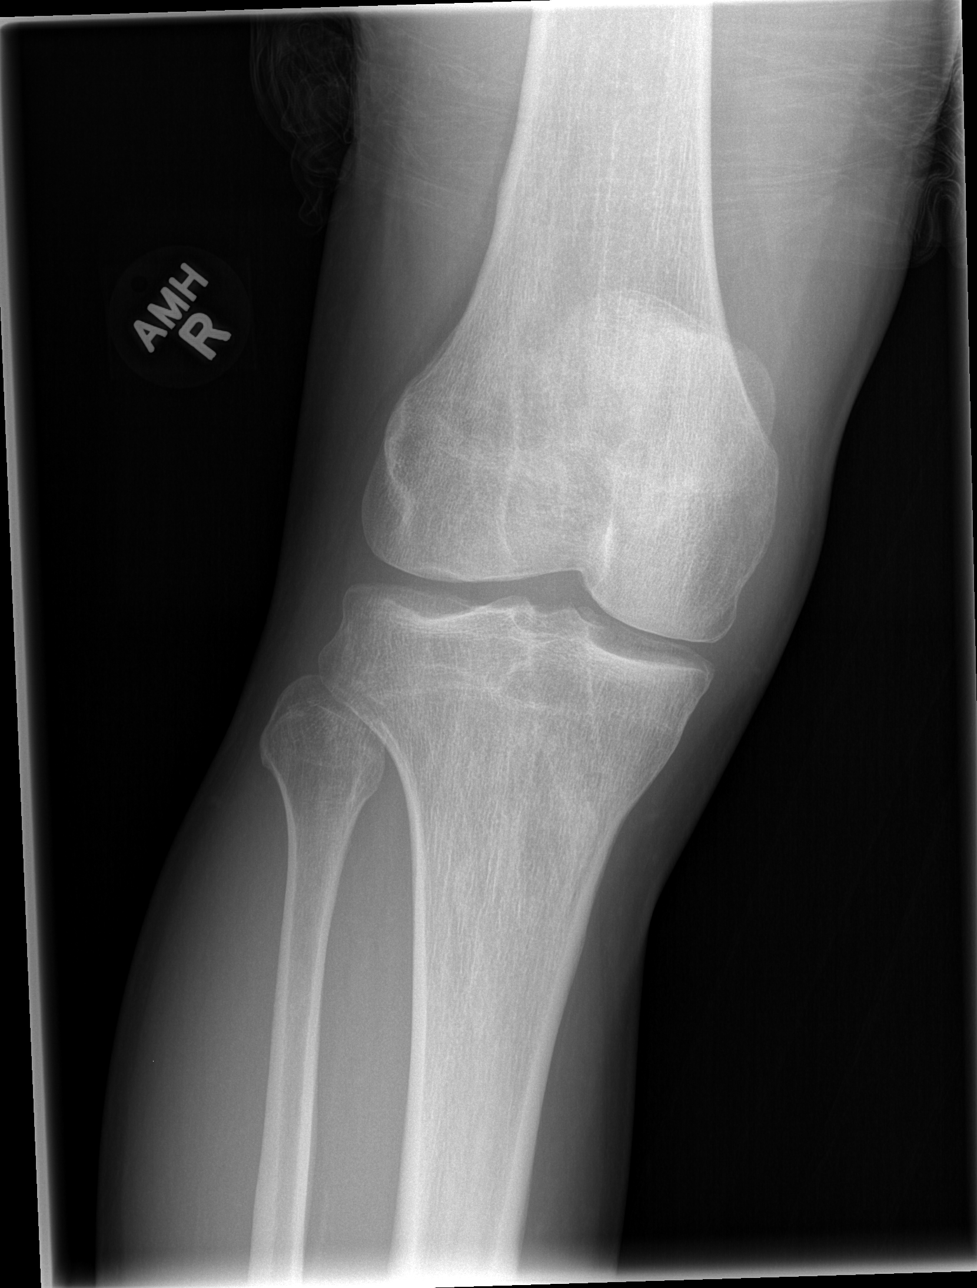

[t knee lat right]
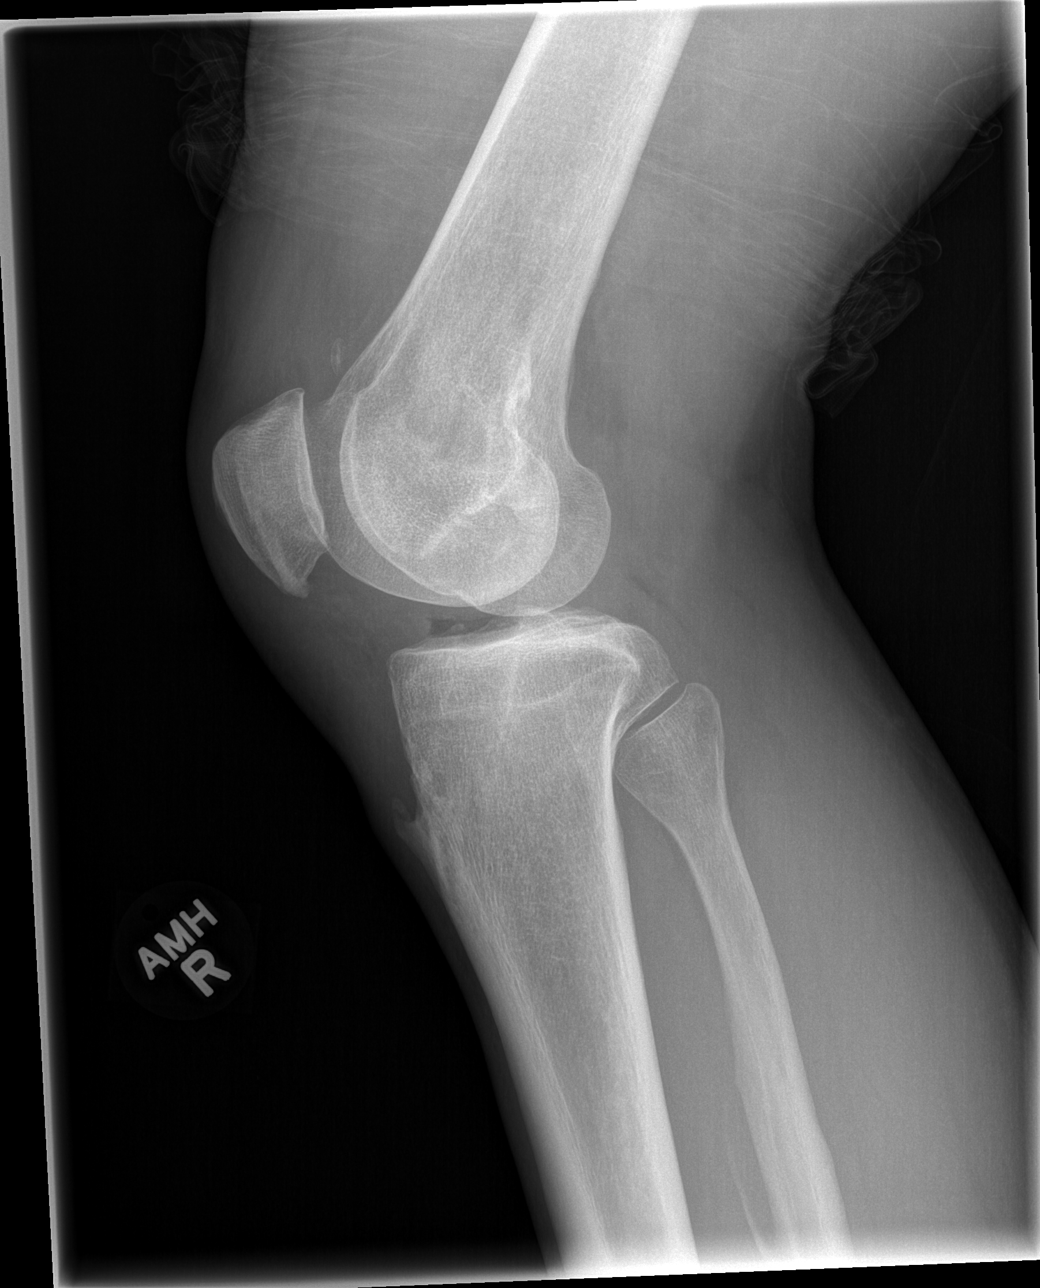

[4 of 4 positions shown; findings below may reference images not displayed]

FINDINGS: There is no evidence of fracture, dislocation, or joint effusion.
Postop changes from previous ACL reconstruction noted. There is no
evidence of arthropathy or other focal bone abnormality. Soft
tissues are unremarkable.
IMPRESSION: Prior ACL reconstruction.  No acute findings.

## 2017-07-30 IMAGING — CR DG CHEST 2V
2 series · 2 of 2 positions shown · non-contrast
Comparison: 09/10/2010.

CLINICAL DATA: 38-year-old male with PTSD. Initial encounter.

EXAM:
CHEST  2 VIEW

[w chest pa]
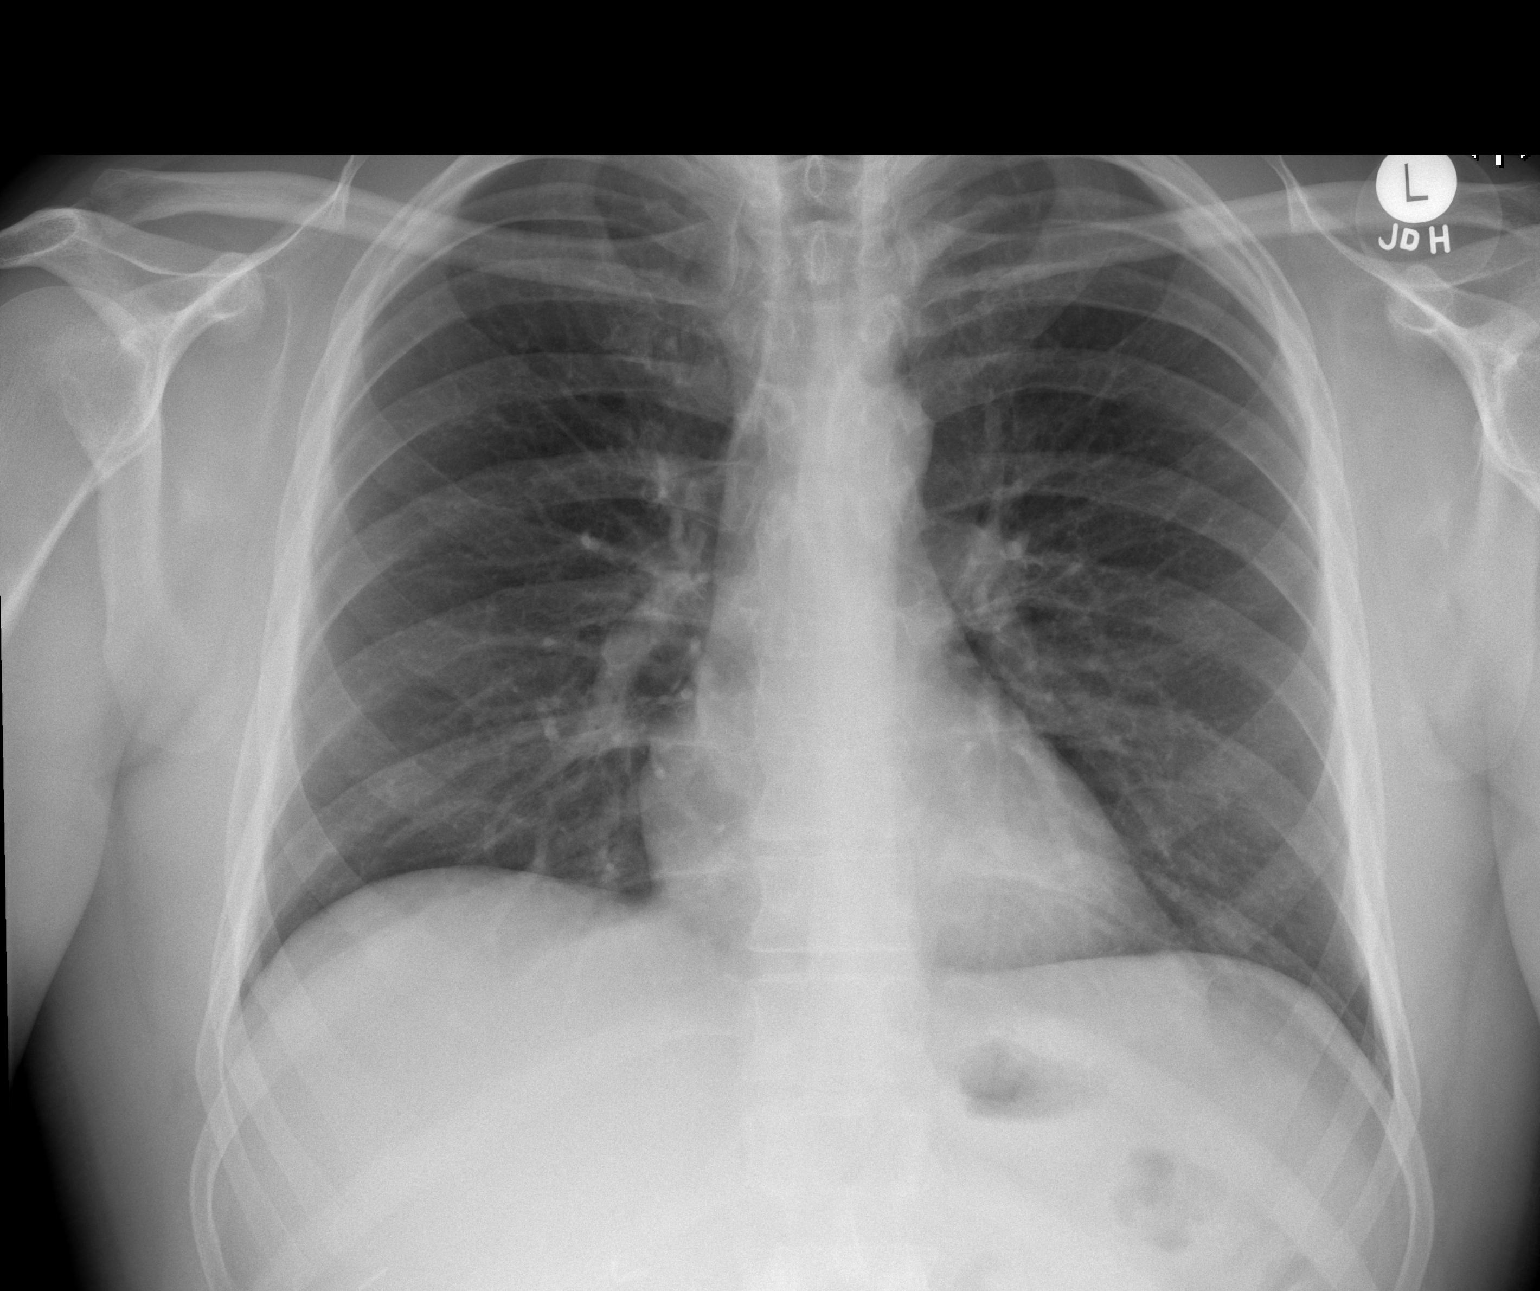

[w chest lat]
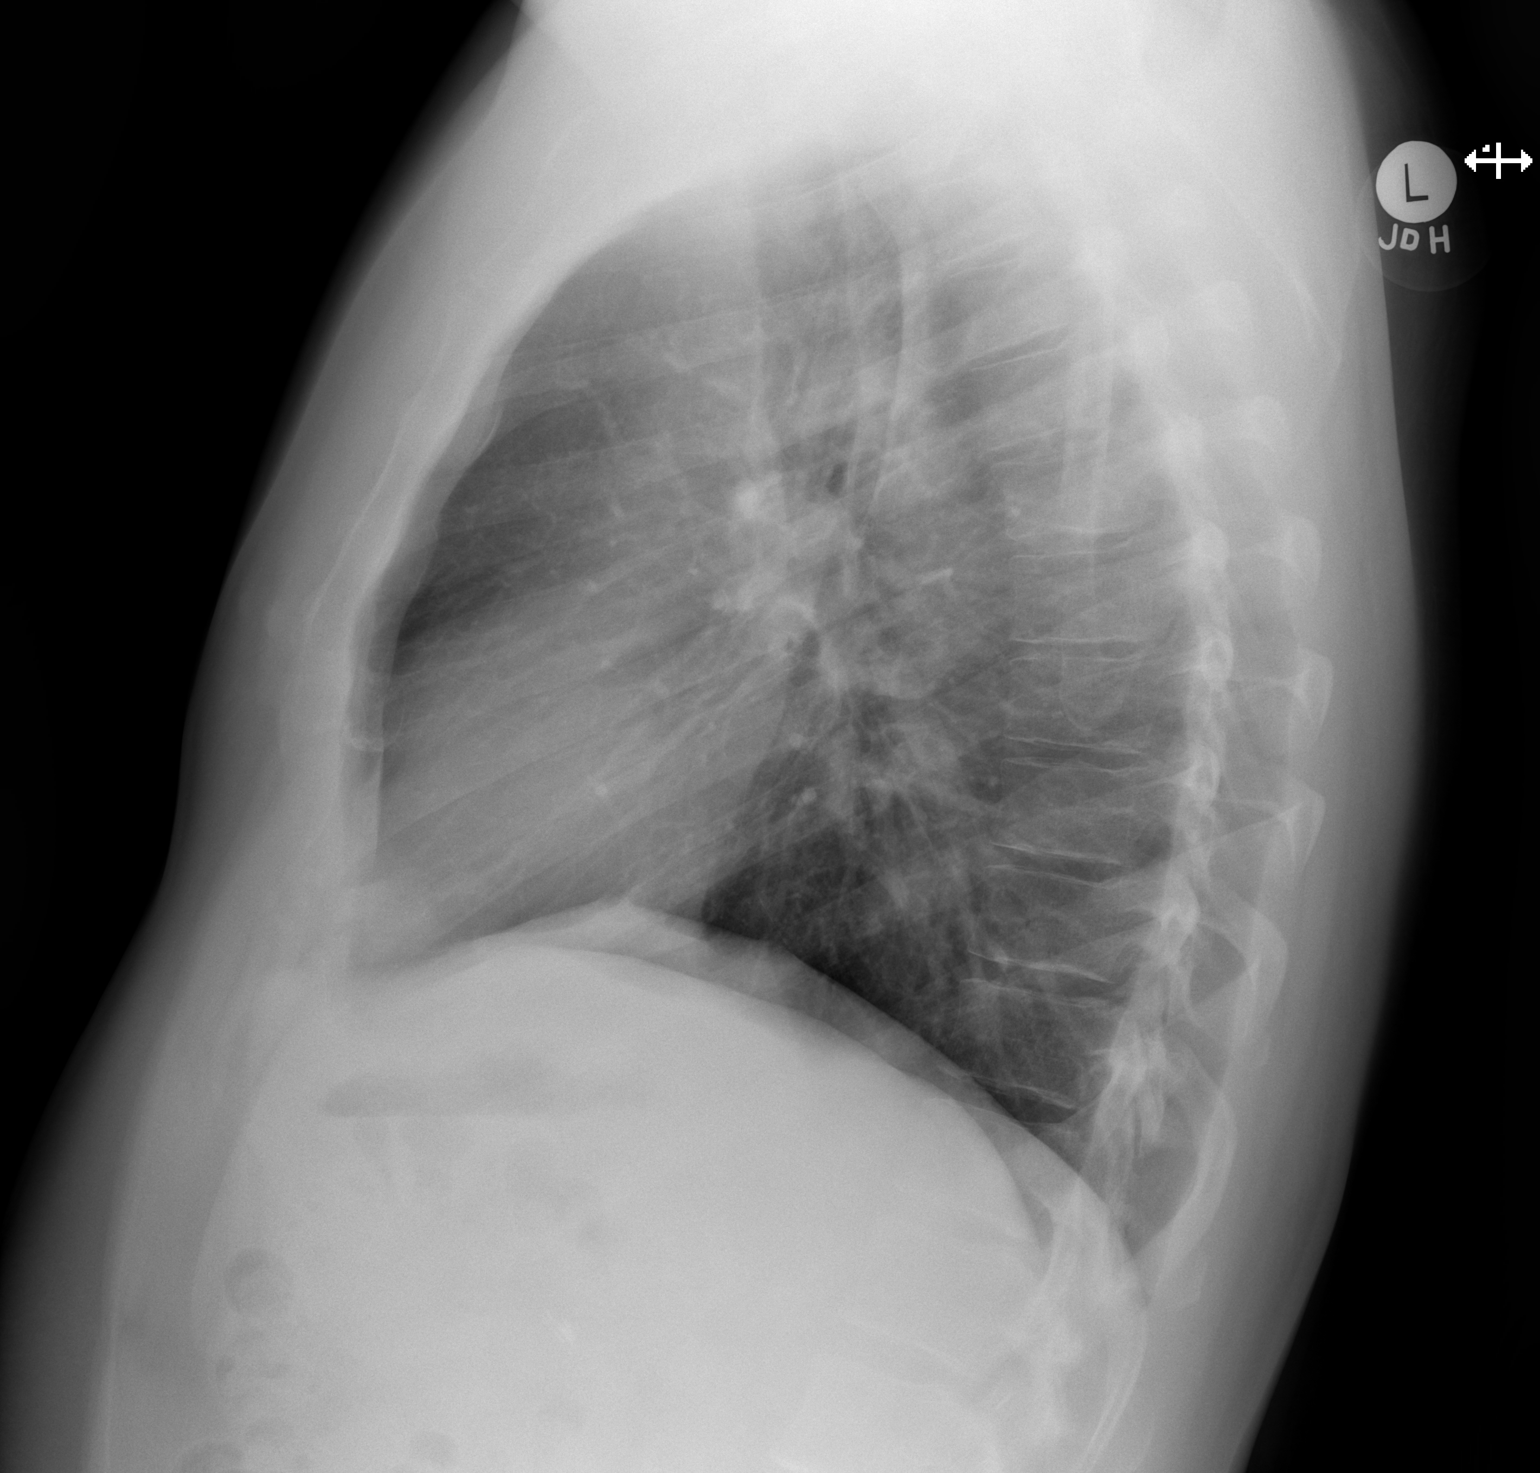

[2 of 2 positions shown; findings below may reference images not displayed]

FINDINGS: Lung volumes remain normal. Normal cardiac size and mediastinal
contours. Visualized tracheal air column is within normal limits.
Both lungs are clear. No pneumothorax or effusion. Stable
cholecystectomy clips. No acute osseous abnormality identified.
IMPRESSION: Negative, no acute cardiopulmonary abnormality.

## 2022-10-26 ENCOUNTER — Ambulatory Visit: Payer: Self-pay

## 2022-10-26 NOTE — Progress Notes (Unsigned)
Completed labs for scheduled physical.

## 2022-11-04 ENCOUNTER — Encounter: Payer: Self-pay | Admitting: Physician Assistant
